# Patient Record
Sex: Female | Born: 1970 | Race: White | Hispanic: No | Marital: Married | Smoking: Never smoker
Health system: Southern US, Community
[De-identification: ages and names within clinical notes are randomized; demographics above are authoritative.]

## PROBLEM LIST (undated history)

## (undated) DIAGNOSIS — I10 Essential (primary) hypertension: Secondary | ICD-10-CM

## (undated) DIAGNOSIS — K9041 Non-celiac gluten sensitivity: Secondary | ICD-10-CM

## (undated) DIAGNOSIS — T7840XA Allergy, unspecified, initial encounter: Secondary | ICD-10-CM

## (undated) DIAGNOSIS — E785 Hyperlipidemia, unspecified: Secondary | ICD-10-CM

## (undated) HISTORY — DX: Non-celiac gluten sensitivity: K90.41

## (undated) HISTORY — DX: Hyperlipidemia, unspecified: E78.5

## (undated) HISTORY — PX: TUBAL LIGATION: SHX77

## (undated) HISTORY — DX: Allergy, unspecified, initial encounter: T78.40XA

## (undated) HISTORY — DX: Essential (primary) hypertension: I10

---

## 1998-03-04 ENCOUNTER — Other Ambulatory Visit: Admission: RE | Admit: 1998-03-04 | Discharge: 1998-03-04 | Payer: Self-pay | Admitting: Obstetrics and Gynecology

## 1999-03-05 ENCOUNTER — Other Ambulatory Visit: Admission: RE | Admit: 1999-03-05 | Discharge: 1999-03-05 | Payer: Self-pay | Admitting: Obstetrics and Gynecology

## 2000-03-06 ENCOUNTER — Other Ambulatory Visit: Admission: RE | Admit: 2000-03-06 | Discharge: 2000-03-06 | Payer: Self-pay | Admitting: *Deleted

## 2001-03-19 ENCOUNTER — Other Ambulatory Visit: Admission: RE | Admit: 2001-03-19 | Discharge: 2001-03-19 | Payer: Self-pay | Admitting: Obstetrics and Gynecology

## 2002-03-11 ENCOUNTER — Inpatient Hospital Stay (HOSPITAL_COMMUNITY): Admission: AD | Admit: 2002-03-11 | Discharge: 2002-03-16 | Payer: Self-pay | Admitting: Obstetrics and Gynecology

## 2002-04-03 ENCOUNTER — Encounter: Admission: RE | Admit: 2002-04-03 | Discharge: 2002-05-03 | Payer: Self-pay | Admitting: Obstetrics and Gynecology

## 2002-04-24 ENCOUNTER — Other Ambulatory Visit: Admission: RE | Admit: 2002-04-24 | Discharge: 2002-04-24 | Payer: Self-pay | Admitting: Obstetrics and Gynecology

## 2003-06-20 ENCOUNTER — Other Ambulatory Visit: Admission: RE | Admit: 2003-06-20 | Discharge: 2003-06-20 | Payer: Self-pay | Admitting: Obstetrics and Gynecology

## 2003-07-06 ENCOUNTER — Inpatient Hospital Stay (HOSPITAL_COMMUNITY): Admission: AD | Admit: 2003-07-06 | Discharge: 2003-07-07 | Payer: Self-pay | Admitting: Obstetrics & Gynecology

## 2003-07-10 ENCOUNTER — Inpatient Hospital Stay (HOSPITAL_COMMUNITY): Admission: AD | Admit: 2003-07-10 | Discharge: 2003-07-11 | Payer: Self-pay | Admitting: Obstetrics and Gynecology

## 2004-11-01 ENCOUNTER — Inpatient Hospital Stay (HOSPITAL_COMMUNITY): Admission: AD | Admit: 2004-11-01 | Discharge: 2004-11-03 | Payer: Self-pay | Admitting: Obstetrics and Gynecology

## 2005-12-20 ENCOUNTER — Encounter: Admission: RE | Admit: 2005-12-20 | Discharge: 2005-12-20 | Payer: Self-pay | Admitting: Gastroenterology

## 2006-02-28 ENCOUNTER — Encounter: Admission: RE | Admit: 2006-02-28 | Discharge: 2006-02-28 | Payer: Self-pay | Admitting: Gastroenterology

## 2006-03-09 ENCOUNTER — Encounter: Admission: RE | Admit: 2006-03-09 | Discharge: 2006-03-09 | Payer: Self-pay | Admitting: Gastroenterology

## 2011-02-08 ENCOUNTER — Other Ambulatory Visit: Payer: Self-pay | Admitting: Obstetrics and Gynecology

## 2012-04-17 ENCOUNTER — Ambulatory Visit (INDEPENDENT_AMBULATORY_CARE_PROVIDER_SITE_OTHER): Payer: BC Managed Care – PPO | Admitting: Emergency Medicine

## 2012-04-17 ENCOUNTER — Encounter: Payer: Self-pay | Admitting: Emergency Medicine

## 2012-04-17 VITALS — BP 128/83 | HR 60 | Temp 97.3°F | Resp 16 | Ht 64.0 in | Wt 124.0 lb

## 2012-04-17 DIAGNOSIS — K9041 Non-celiac gluten sensitivity: Secondary | ICD-10-CM

## 2012-04-17 DIAGNOSIS — Z0000 Encounter for general adult medical examination without abnormal findings: Secondary | ICD-10-CM

## 2012-04-17 LAB — COMPREHENSIVE METABOLIC PANEL
Chloride: 103 mEq/L (ref 96–112)
Creat: 0.82 mg/dL (ref 0.50–1.10)
Glucose, Bld: 76 mg/dL (ref 70–99)
Potassium: 3.9 mEq/L (ref 3.5–5.3)
Total Bilirubin: 0.8 mg/dL (ref 0.3–1.2)

## 2012-04-17 LAB — CBC WITH DIFFERENTIAL/PLATELET
Eosinophils Absolute: 0.1 10*3/uL (ref 0.0–0.7)
HCT: 37.6 % (ref 36.0–46.0)
Hemoglobin: 12.7 g/dL (ref 12.0–15.0)
MCV: 86.4 fL (ref 78.0–100.0)
Monocytes Relative: 5 % (ref 3–12)
Neutro Abs: 4.8 10*3/uL (ref 1.7–7.7)
WBC: 6.9 10*3/uL (ref 4.0–10.5)

## 2012-04-17 LAB — POCT URINALYSIS DIPSTICK
Bilirubin, UA: NEGATIVE
Glucose, UA: NEGATIVE
Nitrite, UA: NEGATIVE

## 2012-04-17 LAB — POCT UA - MICROSCOPIC ONLY
Casts, Ur, LPF, POC: NEGATIVE
Crystals, Ur, HPF, POC: NEGATIVE
Mucus, UA: POSITIVE

## 2012-04-17 LAB — LIPID PANEL
Cholesterol: 166 mg/dL (ref 0–200)
HDL: 51 mg/dL (ref >39)
LDL Cholesterol: 99 mg/dL (ref 0–99)

## 2012-04-17 LAB — TSH: TSH: 4.308 u[IU]/mL (ref 0.350–4.500)

## 2012-04-17 NOTE — Progress Notes (Deleted)
  Subjective:    Patient ID: Brittany Howard, female    DOB: December 15, 1970, 41 y.o.   MRN: [id number]  HPI    Review of Systems     Objective:   Physical Exam        Assessment & Plan:

## 2012-04-17 NOTE — Patient Instructions (Signed)
Return in 1 year ?

## 2012-04-17 NOTE — Progress Notes (Signed)
  Subjective:    Patient ID: Brittany Howard, female    DOB: 11-Jul-1971, 41 y.o.   MRN: [id number]  HPI    Review of Systems  Constitutional: Negative.   HENT: Negative.   Eyes: Negative.   Respiratory: Negative.   Cardiovascular: Negative.   Gastrointestinal:       Patient history of gluten intolerance. She's doing well since staying on a gluten-free diet  Genitourinary:       Patient is a regular patient of Dr. Franco Collet  Musculoskeletal: Negative.   Skin: Negative.   Neurological: Negative.   Hematological: Negative.   Psychiatric/Behavioral: Negative.        Objective:   Physical Exam HEENT exam is within normal limits. Her neck is supple. Her chest is clear to auscultation and percussion. Cardiac exam reveals regular rate without murmurs. Abdomen soft liver and spleen not enlarged no tenderness. Extremities are without edema neurological is intact .  EKG NSR      Assessment & Plan:  Routine labs done today as well as a baseline EKG .

## 2012-11-12 ENCOUNTER — Other Ambulatory Visit: Payer: Self-pay

## 2012-11-12 DIAGNOSIS — Z1231 Encounter for screening mammogram for malignant neoplasm of breast: Secondary | ICD-10-CM

## 2012-12-10 ENCOUNTER — Ambulatory Visit
Admission: RE | Admit: 2012-12-10 | Discharge: 2012-12-10 | Disposition: A | Discharge status: Home / Self Care | Payer: BC Managed Care – PPO | Source: Ambulatory Visit

## 2012-12-10 DIAGNOSIS — Z1231 Encounter for screening mammogram for malignant neoplasm of breast: Secondary | ICD-10-CM

## 2013-04-09 ENCOUNTER — Encounter: Payer: BC Managed Care – PPO | Admitting: Emergency Medicine

## 2013-04-23 ENCOUNTER — Encounter: Payer: BC Managed Care – PPO | Admitting: Emergency Medicine

## 2013-05-28 ENCOUNTER — Encounter: Payer: Self-pay | Admitting: Emergency Medicine

## 2013-05-28 ENCOUNTER — Ambulatory Visit (INDEPENDENT_AMBULATORY_CARE_PROVIDER_SITE_OTHER): Payer: BC Managed Care – PPO | Admitting: Emergency Medicine

## 2013-05-28 VITALS — BP 135/83 | HR 50 | Temp 98.3°F | Resp 16 | Ht 64.0 in | Wt 125.0 lb

## 2013-05-28 DIAGNOSIS — Z23 Encounter for immunization: Secondary | ICD-10-CM

## 2013-05-28 DIAGNOSIS — Z0000 Encounter for general adult medical examination without abnormal findings: Secondary | ICD-10-CM

## 2013-05-28 LAB — TSH: TSH: 2.242 u[IU]/mL (ref 0.350–4.500)

## 2013-05-28 LAB — CBC WITH DIFFERENTIAL/PLATELET
Basophils Absolute: 0.1 10*3/uL (ref 0.0–0.1)
Eosinophils Relative: 1 % (ref 0–5)
HCT: 38.5 % (ref 36.0–46.0)
Hemoglobin: 12.7 g/dL (ref 12.0–15.0)
Lymphocytes Relative: 29 % (ref 12–46)
MCV: 87.7 fL (ref 78.0–100.0)
RBC: 4.39 MIL/uL (ref 3.87–5.11)
RDW: 13.3 % (ref 11.5–15.5)

## 2013-05-28 LAB — COMPREHENSIVE METABOLIC PANEL
Albumin: 4.6 g/dL (ref 3.5–5.2)
Alkaline Phosphatase: 61 U/L (ref 39–117)
CO2: 29 mEq/L (ref 19–32)
Calcium: 9.4 mg/dL (ref 8.4–10.5)
Creat: 0.95 mg/dL (ref 0.50–1.10)
Total Bilirubin: 0.3 mg/dL (ref 0.3–1.2)

## 2013-05-28 LAB — POCT URINALYSIS DIPSTICK
Glucose, UA: NEGATIVE
Ketones, UA: NEGATIVE
Leukocytes, UA: NEGATIVE
Protein, UA: NEGATIVE
Urobilinogen, UA: 0.2

## 2013-05-28 NOTE — Progress Notes (Addendum)
@UMFCLOGO @  Patient ID: Brittany Howard MRN: [id number], DOB: 03/05/1971, 42 y.o. Date of Encounter: 05/28/2013, 4:44 PM  Primary Physician: No primary provider on file.  Chief Complaint: Physical (CPE)  HPI: 42 y.o. y/o female with history of noted below here for CPE.  Doing well. No issues/complaints.  LMP:  Pap: MMG: Review of Systems:  Consitutional: No fever, chills, fatigue, night sweats, lymphadenopathy, or weight changes. Eyes: No visual changes, eye redness, or discharge. ENT/Mouth: Ears: No otalgia, tinnitus, hearing loss, discharge. Nose: No congestion, rhinorrhea, sinus pain, or epistaxis. Throat: No sore throat, post nasal drip, or teeth pain. Cardiovascular: No CP, palpitations, diaphoresis, DOE, edema, orthopnea, PND. Respiratory: She has a history of exercise-induced asthma she has Dulera to take but rarely takes it. As a pro-air inhaler to use as  Gastrointestinal: No anorexia, dysphagia, reflux, pain, nausea, vomiting, hematemesis, diarrhea, constipation, BRBPR, or melena. She has a history of gluten sensitivity but as long as she watches her diet she has no symptoms Breast: No discharge, pain, swelling, or mass. Genitourinary: No dysuria, frequency, urgency, hematuria, incontinence, nocturia, amenorrhea, vaginal discharge, pruritis, burning, abnormal bleeding, or pain. She has her regular GYN checkups with her gynecologist . Musculoskeletal: No decreased ROM, myalgias, stiffness, joint swelling, or weakness. Skin: No rash, erythema, lesion changes, pain, warmth, jaundice, or pruritis. Neurological: No headache, dizziness, syncope, seizures, tremors, memory loss, coordination problems, or paresthesias. Psychological: No anxiety, depression, hallucinations, SI/HI. Endocrine: No fatigue, polydipsia, polyphagia, polyuria, or known diabetes. All other systems were reviewed and are otherwise negative.  Past Medical History  Diagnosis Date  . Allergy   . Gluten  intolerance      Past Surgical History  Procedure Laterality Date  . Tubal ligation      1 tube removed due to ectopic pregnancy in 2004.     Home Meds:  Prior to Admission medications   Not on File    Allergies: Not on File  History   Social History  . Marital Status: Single    Spouse Name: N/A    Number of Children: N/A  . Years of Education: N/A   Occupational History  . Not on file.   Social History Main Topics  . Smoking status: Never Smoker   . Smokeless tobacco: Not on file  . Alcohol Use: Yes  . Drug Use: No  . Sexual Activity: Yes   Other Topics Concern  . Not on file   Social History Narrative  . No narrative on file    Family History  Problem Relation Age of Onset  . Hyperlipidemia Mother   . Hypertension Mother   . Alzheimer's disease Father     Physical Exam  Blood pressure 135/83, pulse 50, temperature 98.3 F (36.8 C), resp. rate 16, height 5\' 4"  (1.626 m), weight 125 lb (56.7 kg), last menstrual period 05/22/2013., Body mass index is 21.45 kg/(m^2). General: Well developed, well nourished, in no acute distress. HEENT: Normocephalic, atraumatic. Conjunctiva pink, sclera non-icteric. Pupils 2 mm constricting to 1 mm, round, regular, and equally reactive to light and accomodation. EOMI. Internal auditory canal clear. TMs with good cone of light and without pathology. Nasal mucosa pink. Nares are without discharge. No sinus tenderness. Oral mucosa pink. Dentition . Pharynx without exudate.   Neck: Supple. Trachea midline. No thyromegaly. Full ROM. No lymphadenopathy. Lungs: Clear to auscultation bilaterally without wheezes, rales, or rhonchi. Breathing is of normal effort and unlabored. Cardiovascular: RRR with S1 S2. No murmurs, rubs, or gallops appreciated. Distal pulses  2+ symmetrically. No carotid or abdominal bruits.  Breast: Deferred  Abdomen: Soft, non-tender, non-distended with normoactive bowel sounds. No hepatosplenomegaly or masses.  No rebound/guarding. No CVA tenderness. Without hernias.  Genitourinary:  Deferred   Musculoskeletal: Full range of motion and 5/5 strength throughout. Without swelling, atrophy, tenderness, crepitus, or warmth. Extremities without clubbing, cyanosis, or edema. Calves supple. Skin: Warm and moist without erythema, ecchymosis, wounds, or rash. Neuro: A+Ox3. CN II-XII grossly intact. Moves all extremities spontaneously. Full sensation throughout. Normal gait. DTR 2+ throughout upper and lower extremities. Finger to nose intact. Psych:  Responds to questions appropriately with a normal affect.   Results for orders placed in visit on 05/28/13  POCT URINALYSIS DIPSTICK      Result Value Range   Color, UA yellow     Clarity, UA clear     Glucose, UA neg     Bilirubin, UA neg     Ketones, UA neg     Spec Grav, UA <=1.005     Blood, UA neg     pH, UA 6.0     Protein, UA neg     Urobilinogen, UA 0.2     Nitrite, UA neg     Leukocytes, UA Negative         Assessment/Plan:  42 y.o. y/o female here for CPE her only medical problems a sensitivity to wheat. She exercises regularly drinks rarely and does not smoke. Her weight is perfect. We'll recheck on a yearly basis.  -  Signed, Earl Lites, MD 05/28/2013 4:44 PM

## 2013-06-13 ENCOUNTER — Ambulatory Visit (INDEPENDENT_AMBULATORY_CARE_PROVIDER_SITE_OTHER): Payer: BC Managed Care – PPO | Admitting: Emergency Medicine

## 2013-06-13 VITALS — BP 120/76 | HR 68 | Temp 99.2°F | Resp 16 | Ht 64.0 in | Wt 125.0 lb

## 2013-06-13 DIAGNOSIS — Z87440 Personal history of urinary (tract) infections: Secondary | ICD-10-CM

## 2013-06-13 DIAGNOSIS — R21 Rash and other nonspecific skin eruption: Secondary | ICD-10-CM

## 2013-06-13 LAB — POCT CBC
Granulocyte percent: 61.3 %G (ref 37–80)
Hemoglobin: 14 g/dL (ref 12.2–16.2)
MCHC: 31.7 g/dL — AB (ref 31.8–35.4)
MPV: 11.1 fL (ref 0–99.8)
POC Granulocyte: 1.9 — AB (ref 2–6.9)
POC LYMPH PERCENT: 30.5 %L (ref 10–50)
POC MID %: 8.2 %M (ref 0–12)
Platelet Count, POC: 202 10*3/uL (ref 142–424)
RBC: 4.65 M/uL (ref 4.04–5.48)
WBC: 3.1 10*3/uL — AB (ref 4.6–10.2)

## 2013-06-13 LAB — POCT URINALYSIS DIPSTICK
Bilirubin, UA: NEGATIVE
Blood, UA: NEGATIVE
Ketones, UA: NEGATIVE
Nitrite, UA: NEGATIVE
Protein, UA: NEGATIVE
pH, UA: 5.5

## 2013-06-13 LAB — POCT UA - MICROSCOPIC ONLY
Casts, Ur, LPF, POC: NEGATIVE
Crystals, Ur, HPF, POC: NEGATIVE
RBC, urine, microscopic: NEGATIVE

## 2013-06-13 LAB — COMPREHENSIVE METABOLIC PANEL
Albumin: 4.4 g/dL (ref 3.5–5.2)
Alkaline Phosphatase: 56 U/L (ref 39–117)
BUN: 12 mg/dL (ref 6–23)
CO2: 25 mEq/L (ref 19–32)
Creat: 0.95 mg/dL (ref 0.50–1.10)
Potassium: 4.1 mEq/L (ref 3.5–5.3)
Sodium: 136 mEq/L (ref 135–145)
Total Protein: 7 g/dL (ref 6.0–8.3)

## 2013-06-13 MED ORDER — DIPHENHYDRAMINE HCL 25 MG PO CAPS
25.0000 mg | ORAL_CAPSULE | Freq: Once | ORAL | Status: AC
  Administered 2013-06-13: 25 mg via ORAL

## 2013-06-13 NOTE — Progress Notes (Signed)
  Subjective:    Patient ID: Brittany Howard, female    DOB: 1971/03/30, 42 y.o.   MRN: [id number]  HPI [name] to ER last week for UTI. Was given bactrim. This morning started with a rash. Fever started Sunday night. Appetite has been fine. Eyes have not been red. States labia is really sore this morning, was not previously. Has not done any traveling.    Review of Systems     Objective:   Physical Exam eyes are slightly red. TMs are clear. Throat reveals no ulcerations in the posterior pharynx neck is supple chest is clear heart regular rate no murmurs abdomen soft nontender extremities without edema skin exam reveals a maculopapular rash which involves the entire trunk and extremities.  Results for orders placed in visit on 06/13/13  POCT CBC      Result Value Range   WBC 3.1 (*) 4.6 - 10.2 K/uL   Lymph, poc 0.9  0.6 - 3.4   POC LYMPH PERCENT 30.5  10 - 50 %L   MID (cbc) 0.3  0 - 0.9   POC MID % 8.2  0 - 12 %M   POC Granulocyte 1.9 (*) 2 - 6.9   Granulocyte percent 61.3  37 - 80 %G   RBC 4.65  4.04 - 5.48 M/uL   Hemoglobin 14.0  12.2 - 16.2 g/dL   HCT, POC 14.7  82.9 - 47.9 %   MCV 94.8  80 - 97 fL   MCH, POC 30.1  27 - 31.2 pg   MCHC 31.7 (*) 31.8 - 35.4 g/dL   RDW, POC 56.2     Platelet Count, POC 202  142 - 424 K/uL   MPV 11.1  0 - 99.8 fL  POCT URINALYSIS DIPSTICK      Result Value Range   Color, UA yellow     Clarity, UA clear     Glucose, UA neg     Bilirubin, UA neg     Ketones, UA neg     Spec Grav, UA 1.015     Blood, UA neg     pH, UA 5.5     Protein, UA neg     Urobilinogen, UA 0.2     Nitrite, UA neg     Leukocytes, UA Negative    POCT UA - MICROSCOPIC ONLY      Result Value Range   WBC, Ur, HPF, POC neg     RBC, urine, microscopic neg     Bacteria, U Microscopic trace     Mucus, UA neg     Epithelial cells, urine per micros 0-2     Crystals, Ur, HPF, POC neg     Casts, Ur, LPF, POC neg     Yeast, UA neg          Assessment & Plan:  Patient  given Zyrtec 10 mg Benadryl 25 mg and Zantac 150 mg by mouth. Her physical exam and history are all consistent with a drug reaction to sulfa. She has significant concerns about Stevens-Johnson. Her white count is low. She had no mucosal abnormalities noted. Dr. Karlyn Agee has been kind enough to see the patient.

## 2013-11-15 ENCOUNTER — Other Ambulatory Visit: Payer: Self-pay

## 2013-11-15 DIAGNOSIS — Z1231 Encounter for screening mammogram for malignant neoplasm of breast: Secondary | ICD-10-CM

## 2013-12-13 ENCOUNTER — Ambulatory Visit
Admission: RE | Admit: 2013-12-13 | Discharge: 2013-12-13 | Disposition: A | Discharge status: Home / Self Care | Payer: BC Managed Care – PPO | Source: Ambulatory Visit

## 2013-12-13 DIAGNOSIS — Z1231 Encounter for screening mammogram for malignant neoplasm of breast: Secondary | ICD-10-CM

## 2014-02-06 ENCOUNTER — Ambulatory Visit (INDEPENDENT_AMBULATORY_CARE_PROVIDER_SITE_OTHER): Payer: BC Managed Care – PPO | Admitting: Family Medicine

## 2014-02-06 ENCOUNTER — Telehealth: Payer: Self-pay

## 2014-02-06 VITALS — BP 128/78 | HR 62 | Temp 98.3°F | Resp 18 | Ht 63.0 in | Wt 121.6 lb

## 2014-02-06 DIAGNOSIS — R05 Cough: Secondary | ICD-10-CM

## 2014-02-06 DIAGNOSIS — J309 Allergic rhinitis, unspecified: Secondary | ICD-10-CM

## 2014-02-06 DIAGNOSIS — R059 Cough, unspecified: Secondary | ICD-10-CM

## 2014-02-06 MED ORDER — BENZONATATE 100 MG PO CAPS: 100.0000 mg | ORAL_CAPSULE | Freq: Three times a day (TID) | ORAL | Status: DC | PRN

## 2014-02-06 MED ORDER — BENZONATATE 200 MG PO CAPS: 200.0000 mg | ORAL_CAPSULE | Freq: Three times a day (TID) | ORAL | Status: DC | PRN

## 2014-02-06 MED ORDER — HYDROCODONE-HOMATROPINE 5-1.5 MG/5ML PO SYRP: ORAL_SOLUTION | ORAL | Status: DC

## 2014-02-06 NOTE — Patient Instructions (Signed)
Start Claritin, Zyrtec, or Allegra once per day. Tessalon perles during day as discussed, cough drops or lozenges as needed (avoid mints).  Cough syrup at night if needed. If cough not improving  Into next week - can try over the counter Zantac in case acid reflux is causing the cough, but if not improving in next week to 10 days, or any worsening sooner - return for recheck.   Cough, Adult  A cough is a reflex that helps clear your throat and airways. It can help heal the body or may be a reaction to an irritated airway. A cough may only last 2 or 3 weeks (acute) or may last more than 8 weeks (chronic).  CAUSES Acute cough:  Viral or bacterial infections. Chronic cough:  Infections.  Allergies.  Asthma.  Post-nasal drip.  Smoking.  Heartburn or acid reflux.  Some medicines.  Chronic lung problems (COPD).  Cancer. SYMPTOMS   Cough.  Fever.  Chest pain.  Increased breathing rate.  High-pitched whistling sound when breathing (wheezing).  Colored mucus that you cough up (sputum). TREATMENT   A bacterial cough may be treated with antibiotic medicine.  A viral cough must run its course and will not respond to antibiotics.  Your caregiver may recommend other treatments if you have a chronic cough. HOME CARE INSTRUCTIONS   Only take over-the-counter or prescription medicines for pain, discomfort, or fever as directed by your caregiver. Use cough suppressants only as directed by your caregiver.  Use a cold steam vaporizer or humidifier in your bedroom or home to help loosen secretions.  Sleep in a semi-upright position if your cough is worse at night.  Rest as needed.  Stop smoking if you smoke. SEEK IMMEDIATE MEDICAL CARE IF:   You have pus in your sputum.  Your cough starts to worsen.  You cannot control your cough with suppressants and are losing sleep.  You begin coughing up blood.  You have difficulty breathing.  You develop pain which is getting  worse or is uncontrolled with medicine.  You have a fever. MAKE SURE YOU:   Understand these instructions.  Will watch your condition.  Will get help right away if you are not doing well or get worse. Document Released: 02/11/2011 Document Revised: 11/07/2011 Document Reviewed: 02/11/2011 [facility] Patient Information 2014 [place].

## 2014-02-06 NOTE — Telephone Encounter (Signed)
[facility] called reporting backorder on 100 mg tessalon and asked if they can fill for 200 mg instead. Checked w/Sarah since sig was for 1 tablet TID instead of 1-2 tabs TID. After checking pt's med problems she ok'd the change and I notified pharm.

## 2014-02-06 NOTE — Progress Notes (Signed)
Subjective:    Patient ID: Brittany Howard, female    DOB: July 15, 1971, 43 y.o.   MRN: [id number]  HPI Brittany Howard is a 43 y.o. female  Dry cough, started about a week ago.  Tickle in back of throat. No fever, nonproductive.  No dyspnea. No congestion, no runny nose. No rx meds. No new supplements. No heartburn. Frequent throat clearing/tickle in throat.   Occasional seasonal allergies- no recent meds.   Tx: otc robitussin cough syrup, cough drops - no relief.    Patient Active Problem List   Diagnosis Date Noted  . 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 Allergy   . Gluten intolerance    Past Surgical History  Procedure Laterality Date  . Tubal ligation      1 tube removed due to ectopic pregnancy in 2004.    Allergies  Allergen Reactions  . Sulfa Antibiotics Rash   Prior to Admission medications   Not on File   History   Social History  . Marital Status: Single    Spouse Name: N/A    Number of Children: N/A  . Years of Education: N/A   Occupational History  . Not on file.   Social History Main Topics  . Smoking status: Never Smoker   . Smokeless tobacco: Not on file  . Alcohol Use: 0.0 oz/week    2-3 Glasses of wine per week     Comment: social  . Drug Use: No  . Sexual Activity: Yes   Other Topics Concern  . Not on file   Social History Narrative  . No narrative on file    Review of Systems  Constitutional: Negative for fever and chills.  Respiratory: Positive for cough. Negative for shortness of breath and wheezing.   Cardiovascular: Negative for chest pain and leg swelling.  Gastrointestinal: Negative for abdominal pain.       No heartburn.        Objective:   Physical Exam  Vitals reviewed. Constitutional: She is oriented to person, place, and time. She appears well-developed and well-nourished. No distress.  HENT:  Head: Normocephalic and atraumatic.  Right Ear: Hearing, tympanic membrane,  external ear and ear canal normal.  Left Ear: Hearing, tympanic membrane, external ear and ear canal normal.  Nose: Nose normal. No mucosal edema or rhinorrhea. Right sinus exhibits no maxillary sinus tenderness and no frontal sinus tenderness. Left sinus exhibits no maxillary sinus tenderness and no frontal sinus tenderness.  Mouth/Throat: Oropharynx is clear and moist. No oropharyngeal exudate.  Eyes: Conjunctivae and EOM are normal. Pupils are equal, round, and reactive to light.  Cardiovascular: Normal rate, regular rhythm, normal heart sounds and intact distal pulses.   No murmur heard. Pulmonary/Chest: Effort normal and breath sounds normal. No respiratory distress. She has no wheezes. She has no rhonchi.  Neurological: She is alert and oriented to person, place, and time.  Skin: Skin is warm and dry. No rash noted.  Psychiatric: She has a normal mood and affect. Her behavior is normal.   Filed Vitals:   02/06/14 0949  BP: 128/78  Pulse: 62  Temp: 98.3 F (36.8 C)  TempSrc: Oral  Resp: 18  Height: 5' 3"  (1.6 m)  Weight: 121 lb 9.6 oz (55.157 kg)  SpO2: 98%      Assessment & Plan:   Brittany Howard is a 43 y.o. female Cough - Plan: benzonatate (TESSALON) 100 MG capsule, HYDROcodone-homatropine (HYCODAN) 5-1.5 MG/5ML syrup  Allergic  rhinitis Possible PND from [place] as irritant/dry cough.  Start antihistamine, tessalon perles and hycodan qhs if needed as cough impacting sleep. Can try H2 for possible LPR if not improving next week.  rtc precautions.    Meds ordered this encounter  Medications  . benzonatate (TESSALON) 100 MG capsule    Sig: Take 1 capsule (100 mg total) by mouth 3 (three) times daily as needed for cough.    Dispense:  20 capsule    Refill:  0  . HYDROcodone-homatropine (HYCODAN) 5-1.5 MG/5ML syrup    Sig: 6mby mouth a bedtime as needed for cough.    Dispense:  120 mL    Refill:  0   Patient Instructions  Start Claritin, Zyrtec, or Allegra once per day.  Tessalon perles during day as discussed, cough drops or lozenges as needed (avoid mints).  Cough syrup at night if needed. If cough not improving  Into next week - can try over the counter Zantac in case acid reflux is causing the cough, but if not improving in next week to 10 days, or any worsening sooner - return for recheck.   Cough, Adult  A cough is a reflex that helps clear your throat and airways. It can help heal the body or may be a reaction to an irritated airway. A cough may only last 2 or 3 weeks (acute) or may last more than 8 weeks (chronic).  CAUSES Acute cough:  Viral or bacterial infections. Chronic cough:  Infections.  Allergies.  Asthma.  Post-nasal drip.  Smoking.  Heartburn or acid reflux.  Some medicines.  Chronic lung problems (COPD).  Cancer. SYMPTOMS   Cough.  Fever.  Chest pain.  Increased breathing rate.  High-pitched whistling sound when breathing (wheezing).  Colored mucus that you cough up (sputum). TREATMENT   A bacterial cough may be treated with antibiotic medicine.  A viral cough must run its course and will not respond to antibiotics.  Your caregiver may recommend other treatments if you have a chronic cough. HOME CARE INSTRUCTIONS   Only take over-the-counter or prescription medicines for pain, discomfort, or fever as directed by your caregiver. Use cough suppressants only as directed by your caregiver.  Use a cold steam vaporizer or humidifier in your bedroom or home to help loosen secretions.  Sleep in a semi-upright position if your cough is worse at night.  Rest as needed.  Stop smoking if you smoke. SEEK IMMEDIATE MEDICAL CARE IF:   You have pus in your sputum.  Your cough starts to worsen.  You cannot control your cough with suppressants and are losing sleep.  You begin coughing up blood.  You have difficulty breathing.  You develop pain which is getting worse or is uncontrolled with medicine.  You have  a fever. MAKE SURE YOU:   Understand these instructions.  Will watch your condition.  Will get help right away if you are not doing well or get worse. Document Released: 02/11/2011 Document Revised: 11/07/2011 Document Reviewed: 02/11/2011 [facility] Information 2014 [place]

## 2014-06-11 ENCOUNTER — Ambulatory Visit (INDEPENDENT_AMBULATORY_CARE_PROVIDER_SITE_OTHER): Payer: BC Managed Care – PPO | Admitting: Family Medicine

## 2014-06-11 VITALS — BP 132/82 | HR 64 | Temp 99.0°F | Resp 18 | Ht 64.0 in | Wt 126.2 lb

## 2014-06-11 DIAGNOSIS — Z23 Encounter for immunization: Secondary | ICD-10-CM

## 2014-06-11 DIAGNOSIS — S86811A Strain of other muscle(s) and tendon(s) at lower leg level, right leg, initial encounter: Secondary | ICD-10-CM

## 2014-06-11 NOTE — Patient Instructions (Signed)
Good to see you today! I think that you have a calf muscle strain/ partial tear that will likely be better in a couple of weeks.  You can advance your activity as tolerated, and continue advil, heat/ ice and gentle stretching.    If your calf if NOT improving over the next week or so please let me know- Sooner if worse.   Remember that jumping activities will be the most difficult for your calf; add these in last.

## 2014-06-11 NOTE — Progress Notes (Signed)
[facility] [address] [place] [address] [phone]  Date:  06/11/2014   Name:  Brittany Howard   DOB:  Jan 03, 1971   MRN:  [id number]  PCP:  No PCP Per Patient    Chief Complaint: calf injury   History of Present Illness:  Brittany Howard is a 43 y.o. very pleasant female patient who presents with the following:  Has been active in [facility] for a few years- she practices this with her son for exercise She was sparring at [employer] last night.  She was bouncing up and down on her feet.  She felt a "cramp" in her right calf, stopped and rested for a few minutes.  She then went back to her practice, but felt a "pull or a tear" in the calfthat caused her to stop for good.  It is now feeling somewhat better but is still sore so she wanted to check it out Flexing the ankle hurts - she can walk but it does hurt some.   She did sprain her right ankle about 6 weeks ago- this is getting better and she is wearing an OTC ankle sleeve as needed Otherwise she is generally in good health Would like to get a flu shot today  Patient Active Problem List   Diagnosis Date Noted  . 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 Allergy   . Gluten intolerance     Past Surgical History  Procedure Laterality Date  . Tubal ligation      1 tube removed due to ectopic pregnancy in 2004.     History  Substance Use Topics  . Smoking status: Never Smoker   . Smokeless tobacco: Not on file  . Alcohol Use: 0.0 oz/week    2-3 Glasses of wine per week     Comment: social    Family History  Problem Relation Age of Onset  . Hyperlipidemia Mother   . Hypertension Mother   . Alzheimer's disease Father   . Hyperlipidemia Father   . Hypertension Father   . Heart disease Paternal Grandmother   . Stroke Paternal Grandmother     Allergies  Allergen Reactions  . Sulfa Antibiotics Rash    Medication list has been reviewed and  updated.  No current outpatient prescriptions on file prior to visit.   No current facility-administered medications on file prior to visit.    Review of Systems:  As per HPI- otherwise negative.   Physical Examination: Filed Vitals:   06/11/14 0915  BP: 132/82  Pulse: 64  Temp: 99 F (37.2 C)  Resp: 18   Filed Vitals:   06/11/14 0915  Height: 5' 4"  (1.626 m)  Weight: 126 lb 3.2 oz (57.244 kg)   Body mass index is 21.65 kg/(m^2). Ideal Body Weight: Weight in (lb) to have BMI = 25: 145.3  GEN: WDWN, NAD, Non-toxic, A & O x 3, looks well, fit and healthy HEENT: Atraumatic, Normocephalic. Neck supple. No masses, No LAD. Ears and Nose: No external deformity. CV: RRR, No M/G/R. No JVD. No thrill. No extra heart sounds. PULM: CTA B, no wheezes, crackles, rhonchi. No retractions. No resp. distress. No accessory muscle use. EXTR: No c/c/e NEURO favoring right calf slightly PSYCH: Normally interactive. Conversant. Not depressed or anxious appearing.  Calm demeanor.  Right calf: achilles is intact.  Ankle is non- tender and not swollen.  Mild tenderness in the mid medial calf muscle.  Normal strength  and ROM at the knee and calf, normal foot cap refill and sensation.  No swelling of right calf, measures equally to left   Assessment and Plan: Strain of calf muscle, right, initial encounter - Plan: Flu Vaccine QUAD 36+ mos IM  Immunization due  Strain/ partial tear of right calf muscle.  Reassured that this will likely do well with conservative management.  Advised a graded return to activity, and she will let me know if not better in the next few days- Sooner if worse.     Signed Lamar Blinks, MD

## 2015-09-28 ENCOUNTER — Ambulatory Visit (INDEPENDENT_AMBULATORY_CARE_PROVIDER_SITE_OTHER): Payer: BLUE CROSS/BLUE SHIELD

## 2015-09-28 ENCOUNTER — Ambulatory Visit (INDEPENDENT_AMBULATORY_CARE_PROVIDER_SITE_OTHER): Payer: BLUE CROSS/BLUE SHIELD | Admitting: Emergency Medicine

## 2015-09-28 VITALS — BP 122/82 | HR 76 | Temp 98.6°F | Resp 17 | Ht 64.0 in | Wt 131.0 lb

## 2015-09-28 DIAGNOSIS — S60221A Contusion of right hand, initial encounter: Secondary | ICD-10-CM

## 2015-09-28 NOTE — Patient Instructions (Addendum)
Because you received an x-ray today, you will receive an invoice from [facility]. Please contact [facility] at [phone] with questions or concerns regarding your invoice. Our billing staff will not be able to assist you with those questions. The radiologist did not see a fracture on your films. Please avoid any direct contact to your thumb for one week. If your pain is persistent at that time please call and let me know.

## 2015-09-28 NOTE — Progress Notes (Addendum)
By signing my name below, I, Raven Small, attest that this documentation has been prepared under the direction and in the presence of Arlyss Queen, MD.  Electronically Signed: Thea Alken, ED Scribe. 09/28/2015. 8:58 AM.  Chief Complaint:  Chief Complaint  Patient presents with  . Hand Injury    right side    HPI: Brittany Howard is a 45 y.o. female who reports to [facility] today complaining of right hand injury. Pt does [place] do[place] and states she had to break 2 1inch boards put together with right hand. Since then she had graduallyt worsening right hand pain. She has been doing this for 4 years and has her black belt. Pt denies chance of pregnancy. She has hx of tubal ligation. Her husband had a vasectomy. LMP was 1 week ago.    Past Medical History  Diagnosis Date  . Allergy   . Gluten intolerance    Past Surgical History  Procedure Laterality Date  . Tubal ligation      1 tube removed due to ectopic pregnancy in 2004.    Social History   Social History  . Marital Status: Single    Spouse Name: N/A  . Number of Children: N/A  . Years of Education: N/A   Social History Main Topics  . Smoking status: Never Smoker   . Smokeless tobacco: None  . Alcohol Use: 0.0 oz/week    2-3 Glasses of wine per week     Comment: social  . Drug Use: No  . Sexual Activity: Yes   Other Topics Concern  . None   Social History Narrative   Family History  Problem Relation Age of Onset  . Hyperlipidemia Mother   . Hypertension Mother   . Alzheimer's disease Father   . Hyperlipidemia Father   . Hypertension Father   . Heart disease Paternal Grandmother   . Stroke Paternal Grandmother    Allergies  Allergen Reactions  . Sulfa Antibiotics Rash   Prior to Admission medications   Not on File     ROS: The patient denies fevers, chills, night sweats, unintentional weight loss, chest pain, palpitations, wheezing, dyspnea on exertion, nausea, vomiting, abdominal pain, dysuria,  hematuria, melena, numbness, weakness, or tingling.   All other systems have been reviewed and were otherwise negative with the exception of those mentioned in the HPI and as above.    PHYSICAL EXAM: Filed Vitals:   09/28/15 0850  BP: 122/82  Pulse: 76  Temp: 98.6 F (37 C)  Resp: 17   Body mass index is 22.47 kg/(m^2).   General: Alert, no acute distress HEENT:  Normocephalic, atraumatic, oropharynx patent. Eye: Juliette Mangle [facility] Cardiovascular:  Regular rate and rhythm, no rubs murmurs or gallops.  No Carotid bruits, radial pulse intact. No pedal edema.  Respiratory: Clear to auscultation bilaterally.  No wheezes, rales, or rhonchi.  No cyanosis, no use of accessory musculature Abdominal: No organomegaly, abdomen is soft and non-tender, positive bowel sounds.  No masses. Musculoskeletal: Gait intact. Bruising present over the base of the [facility] of right hand. Near full ROM. no joint instability.  Skin: No rashes. Neurologic: Facial musculature symmetric. Psychiatric: Patient acts appropriately throughout our interaction. Lymphatic: No cervical or submandibular lymphadenopathy   EKG/XRAY:   Primary read interpreted by Dr. Everlene Farrier at [facility]. On one view there appears to be a possible crack in the trapezium please comment.  Dg Finger Thumb Right  09/28/2015  CLINICAL DATA:  Right thumb injury. EXAM: RIGHT THUMB 2+V  COMPARISON:  No recent prior . FINDINGS: There is no evidence of fracture or dislocation. There is no evidence of arthropathy or other focal bone abnormality. Soft tissues are unremarkable IMPRESSION: No acute bony or joint abnormality identified. No evidence of fracture or dislocation. Electronically Signed   By: Marcello Moores  Register   On: 09/28/2015 09:16   ASSESSMENT/PLAN: The radiologist did not feel there was a fracture. Will treat the area with ice along with nonsteroidals she is to call me the end of the week if pain is persistent.   Gross sideeffects, risk and benefits,  and alternatives of medications d/w patient. Patient is aware that all medications have potential sideeffects and we are unable to predict every sideeffect or drug-drug interaction that may occur.  Arlyss Queen MD 09/28/2015 8:53 AM

## 2016-05-11 DIAGNOSIS — Z6822 Body mass index (BMI) 22.0-22.9, adult: Secondary | ICD-10-CM | POA: Diagnosis not present

## 2016-05-11 DIAGNOSIS — Z01419 Encounter for gynecological examination (general) (routine) without abnormal findings: Secondary | ICD-10-CM | POA: Diagnosis not present

## 2016-05-11 DIAGNOSIS — Z1231 Encounter for screening mammogram for malignant neoplasm of breast: Secondary | ICD-10-CM | POA: Diagnosis not present

## 2016-05-13 ENCOUNTER — Other Ambulatory Visit: Payer: Self-pay | Admitting: Obstetrics and Gynecology

## 2016-05-13 DIAGNOSIS — R928 Other abnormal and inconclusive findings on diagnostic imaging of breast: Secondary | ICD-10-CM

## 2016-05-23 ENCOUNTER — Ambulatory Visit
Admission: RE | Admit: 2016-05-23 | Discharge: 2016-05-23 | Disposition: A | Discharge status: Home / Self Care | Payer: BLUE CROSS/BLUE SHIELD | Source: Ambulatory Visit | Attending: Obstetrics and Gynecology | Admitting: Obstetrics and Gynecology

## 2016-05-23 DIAGNOSIS — R928 Other abnormal and inconclusive findings on diagnostic imaging of breast: Secondary | ICD-10-CM

## 2016-05-23 DIAGNOSIS — R922 Inconclusive mammogram: Secondary | ICD-10-CM | POA: Diagnosis not present

## 2016-11-27 IMAGING — MG 2D DIGITAL DIAGNOSTIC UNILATERAL RIGHT MAMMOGRAM WITH CAD AND AD
4 series · 4 of 12 positions shown · non-contrast
Comparison: Previous exam(s).

CLINICAL DATA: The recall from screening mammography.

EXAM:
2D DIGITAL DIAGNOSTIC UNILATERAL RIGHT MAMMOGRAM WITH CAD AND
ADJUNCT TOMO

[R ML]
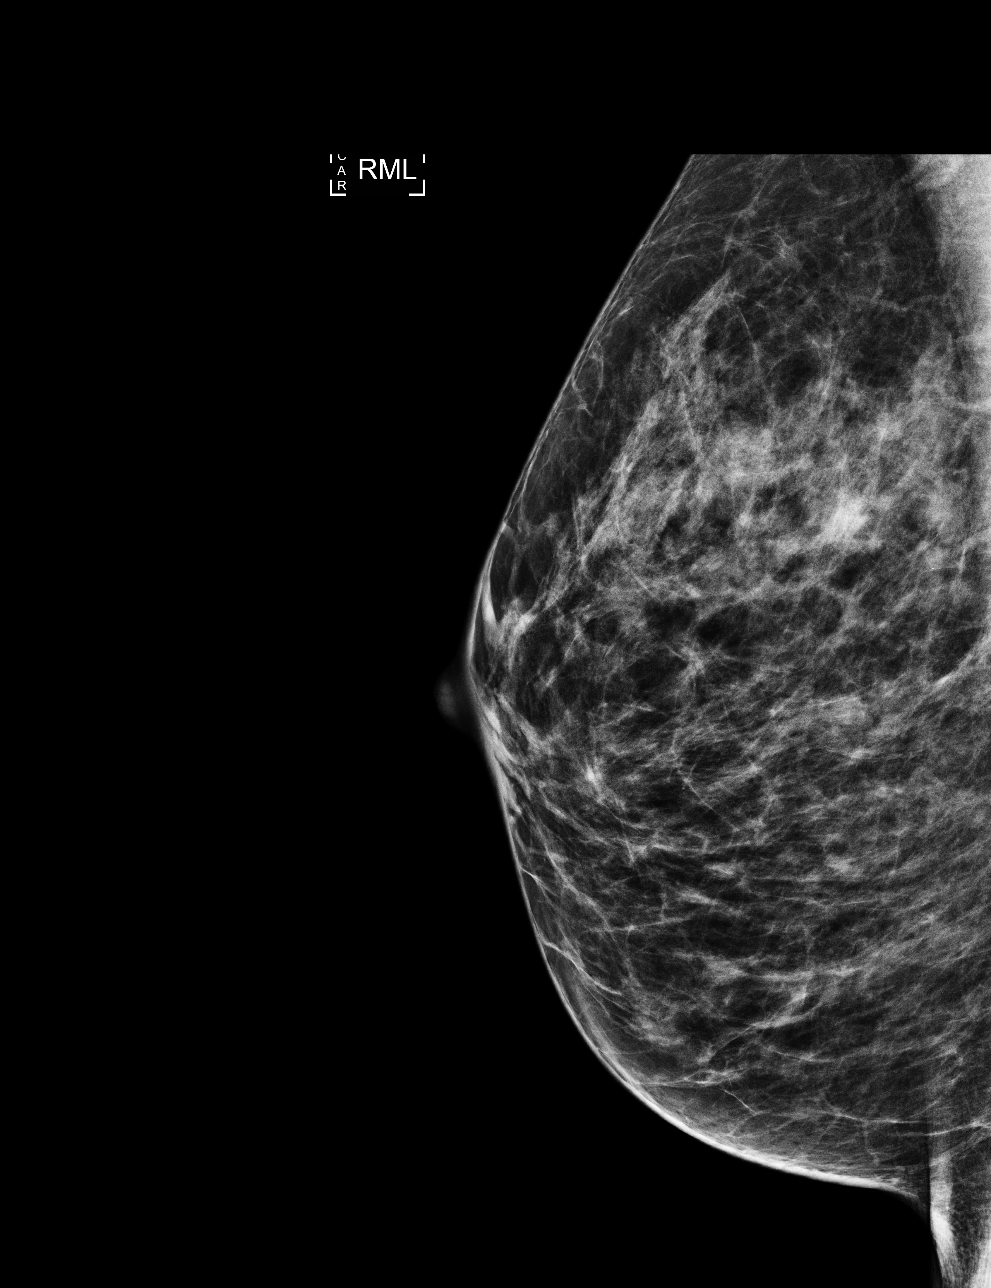

[R CC]
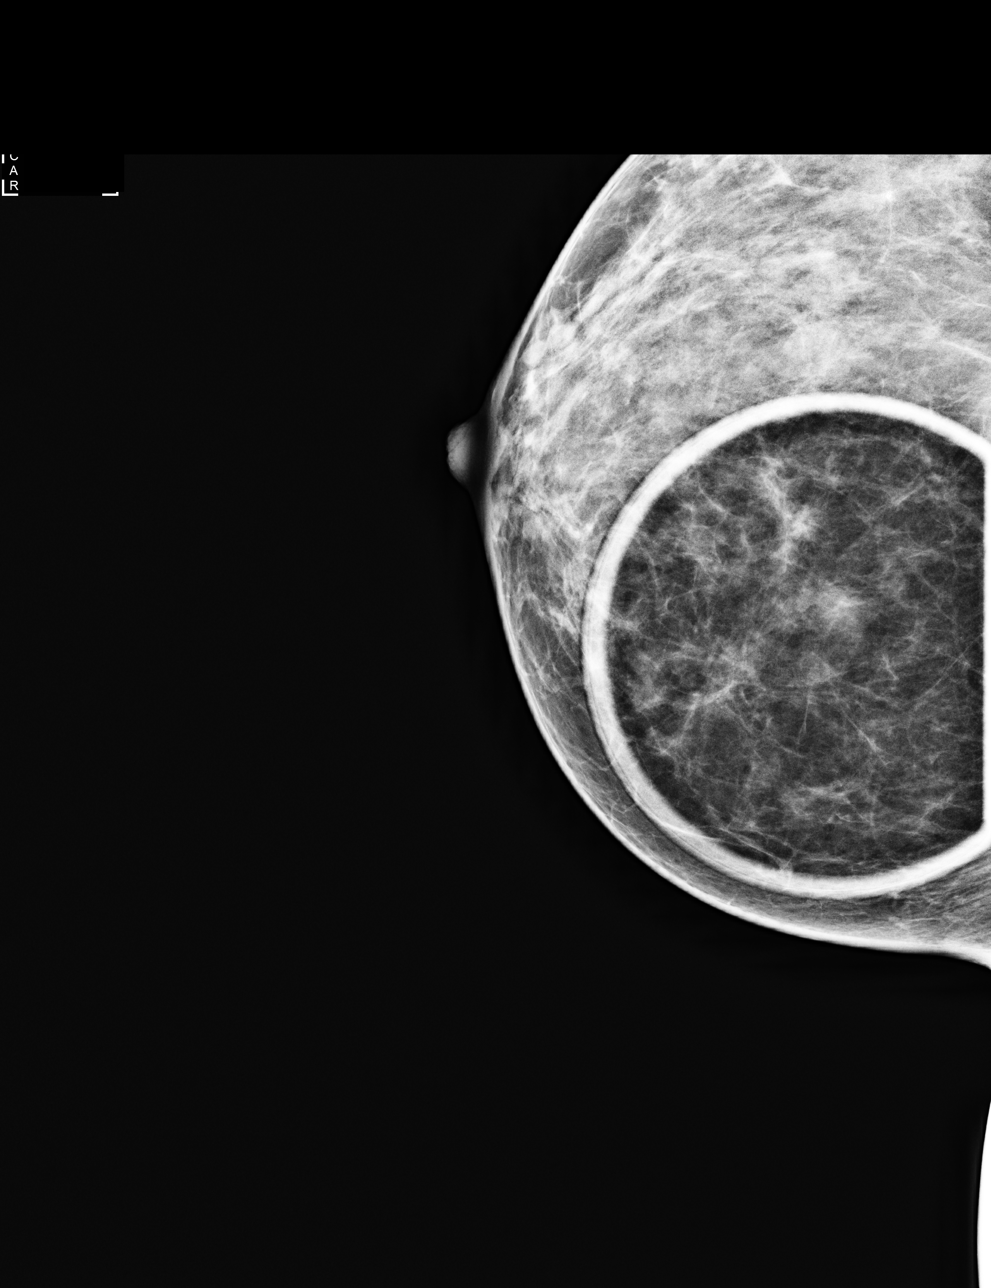

[R ML tomo · tomo slice 25/49.0]
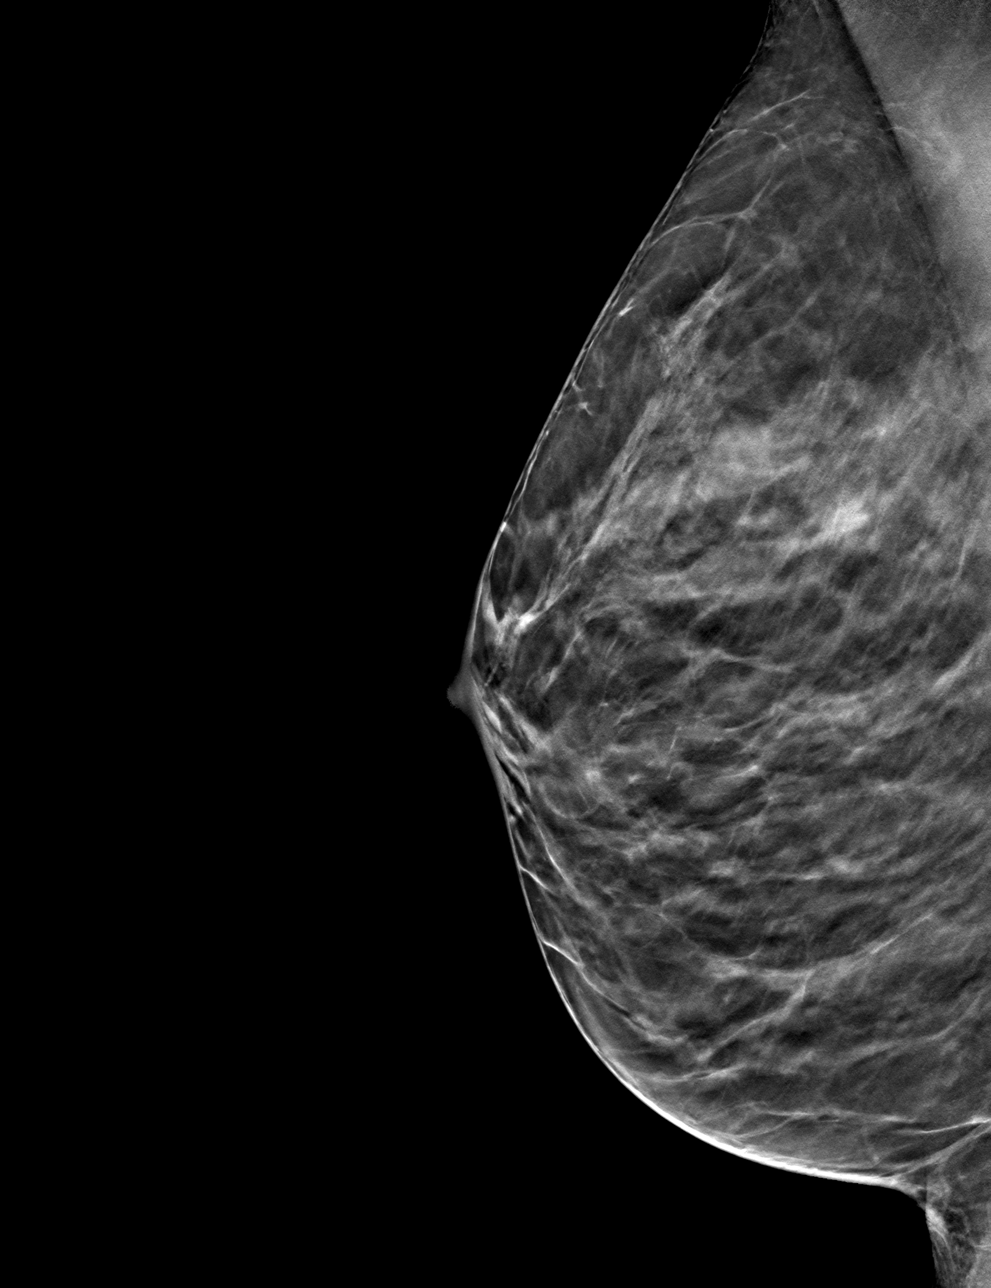

[R CC tomo · tomo slice 24/47.0]
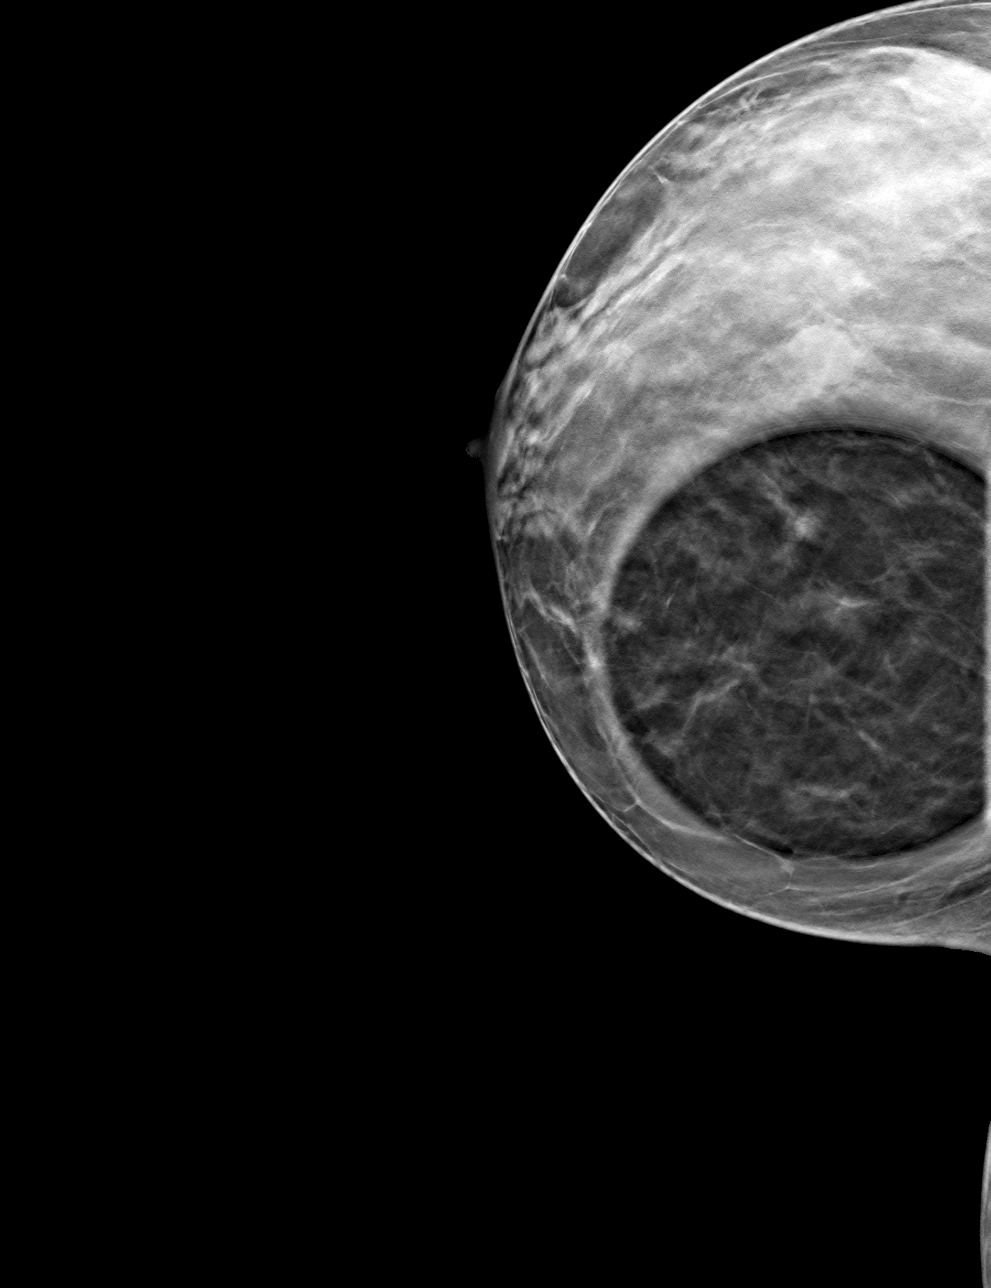

[4 of 12 positions shown; findings below may reference images not displayed]

ACR Breast Density Category c: The breast tissue is heterogeneously
dense, which may obscure small masses.
FINDINGS: Additional views of the right breast with tomosynthesis demonstrate
no persistent abnormality. The appearance noted on the screening
study is consistent with a summation shadow.

Mammographic images were processed with CAD.
IMPRESSION: No persistent abnormality on additional evaluation the right breast.

RECOMMENDATION:
Screening mammography in 1 year.

I have discussed the findings and recommendations with the patient.
Results were also provided in writing at the conclusion of the
visit. If applicable, a reminder letter will be sent to the patient
regarding the next appointment.

BI-RADS CATEGORY  1: Negative.

## 2017-07-31 DIAGNOSIS — Z01419 Encounter for gynecological examination (general) (routine) without abnormal findings: Secondary | ICD-10-CM | POA: Diagnosis not present

## 2017-07-31 DIAGNOSIS — Z6822 Body mass index (BMI) 22.0-22.9, adult: Secondary | ICD-10-CM | POA: Diagnosis not present

## 2017-07-31 DIAGNOSIS — Z1231 Encounter for screening mammogram for malignant neoplasm of breast: Secondary | ICD-10-CM | POA: Diagnosis not present

## 2018-08-24 DIAGNOSIS — Z1151 Encounter for screening for human papillomavirus (HPV): Secondary | ICD-10-CM | POA: Diagnosis not present

## 2018-08-24 DIAGNOSIS — Z01419 Encounter for gynecological examination (general) (routine) without abnormal findings: Secondary | ICD-10-CM | POA: Diagnosis not present

## 2018-08-24 DIAGNOSIS — Z124 Encounter for screening for malignant neoplasm of cervix: Secondary | ICD-10-CM | POA: Diagnosis not present

## 2018-08-24 DIAGNOSIS — Z6823 Body mass index (BMI) 23.0-23.9, adult: Secondary | ICD-10-CM | POA: Diagnosis not present

## 2018-08-24 DIAGNOSIS — Z113 Encounter for screening for infections with a predominantly sexual mode of transmission: Secondary | ICD-10-CM | POA: Diagnosis not present

## 2018-08-24 DIAGNOSIS — Z23 Encounter for immunization: Secondary | ICD-10-CM | POA: Diagnosis not present

## 2018-08-24 DIAGNOSIS — Z1231 Encounter for screening mammogram for malignant neoplasm of breast: Secondary | ICD-10-CM | POA: Diagnosis not present

## 2019-02-27 DIAGNOSIS — Z1159 Encounter for screening for other viral diseases: Secondary | ICD-10-CM | POA: Diagnosis not present

## 2019-10-16 DIAGNOSIS — Z6821 Body mass index (BMI) 21.0-21.9, adult: Secondary | ICD-10-CM | POA: Diagnosis not present

## 2019-10-16 DIAGNOSIS — Z1231 Encounter for screening mammogram for malignant neoplasm of breast: Secondary | ICD-10-CM | POA: Diagnosis not present

## 2019-10-16 DIAGNOSIS — Z01419 Encounter for gynecological examination (general) (routine) without abnormal findings: Secondary | ICD-10-CM | POA: Diagnosis not present

## 2019-10-19 ENCOUNTER — Other Ambulatory Visit: Payer: Self-pay | Admitting: Obstetrics and Gynecology

## 2019-10-19 DIAGNOSIS — R928 Other abnormal and inconclusive findings on diagnostic imaging of breast: Secondary | ICD-10-CM

## 2019-10-21 ENCOUNTER — Other Ambulatory Visit: Payer: Self-pay | Admitting: Obstetrics and Gynecology

## 2019-10-21 DIAGNOSIS — R928 Other abnormal and inconclusive findings on diagnostic imaging of breast: Secondary | ICD-10-CM

## 2019-10-25 DIAGNOSIS — Z13220 Encounter for screening for lipoid disorders: Secondary | ICD-10-CM | POA: Diagnosis not present

## 2019-10-25 DIAGNOSIS — Z1329 Encounter for screening for other suspected endocrine disorder: Secondary | ICD-10-CM | POA: Diagnosis not present

## 2019-10-25 DIAGNOSIS — Z130 Encounter for screening for diseases of the blood and blood-forming organs and certain disorders involving the immune mechanism: Secondary | ICD-10-CM | POA: Diagnosis not present

## 2019-10-25 DIAGNOSIS — Z0000 Encounter for general adult medical examination without abnormal findings: Secondary | ICD-10-CM | POA: Diagnosis not present

## 2019-11-06 ENCOUNTER — Ambulatory Visit
Admission: RE | Admit: 2019-11-06 | Discharge: 2019-11-06 | Disposition: A | Discharge status: Home / Self Care | Payer: BLUE CROSS/BLUE SHIELD | Source: Ambulatory Visit | Attending: Obstetrics and Gynecology | Admitting: Obstetrics and Gynecology

## 2019-11-06 ENCOUNTER — Other Ambulatory Visit: Payer: Self-pay

## 2019-11-06 DIAGNOSIS — N6012 Diffuse cystic mastopathy of left breast: Secondary | ICD-10-CM | POA: Diagnosis not present

## 2019-11-06 DIAGNOSIS — R928 Other abnormal and inconclusive findings on diagnostic imaging of breast: Secondary | ICD-10-CM

## 2019-11-06 DIAGNOSIS — R922 Inconclusive mammogram: Secondary | ICD-10-CM | POA: Diagnosis not present

## 2020-05-12 IMAGING — US US BREAST*L* LIMITED INC AXILLA
1 series · 6 of 6 positions shown · non-contrast
Comparison: Previous exam(s).

CLINICAL DATA: Screening recall for a possible left breast mass.

EXAM:
DIGITAL DIAGNOSTIC LEFT MAMMOGRAM WITH CAD AND TOMO
ULTRASOUND LEFT BREAST

[Series 1: us breast*left* limited inc axilla · 0.06mm/px · 6 of 6 slices shown]
[im 1/6]
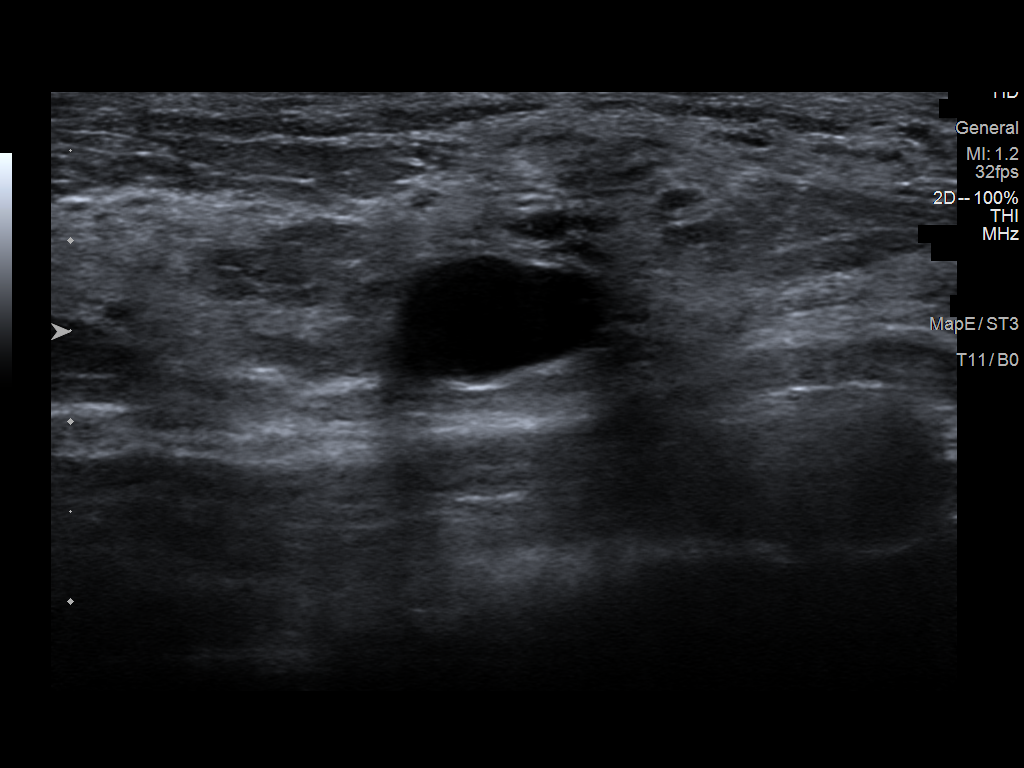
[im 2/6]
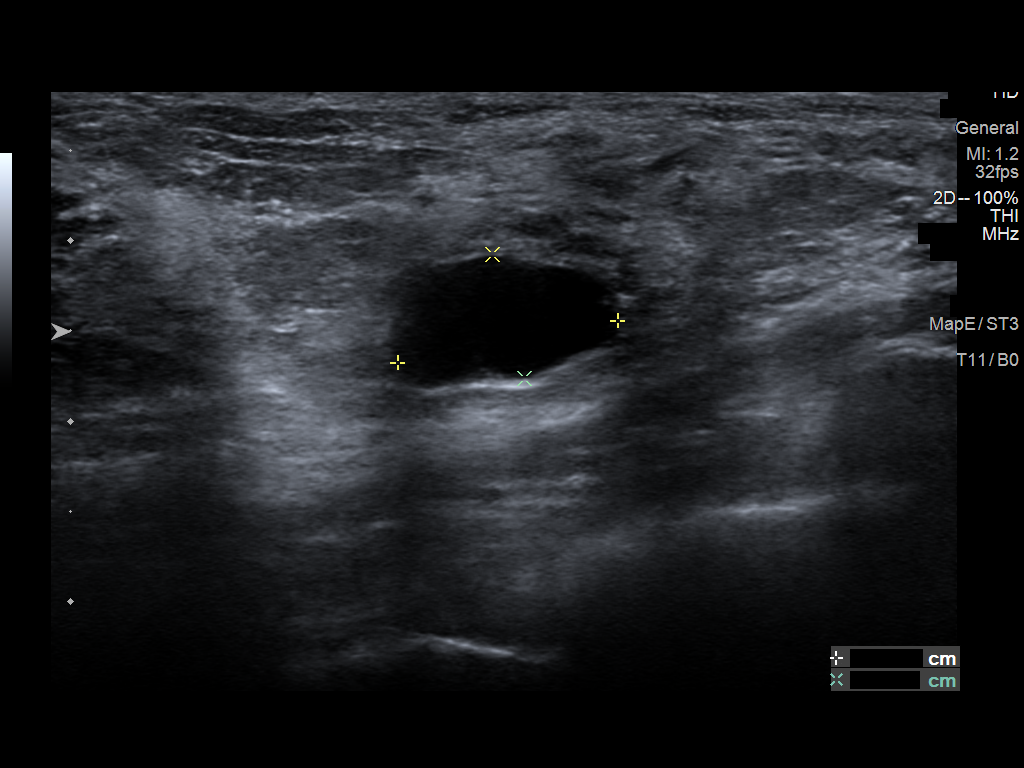
[im 3/6]
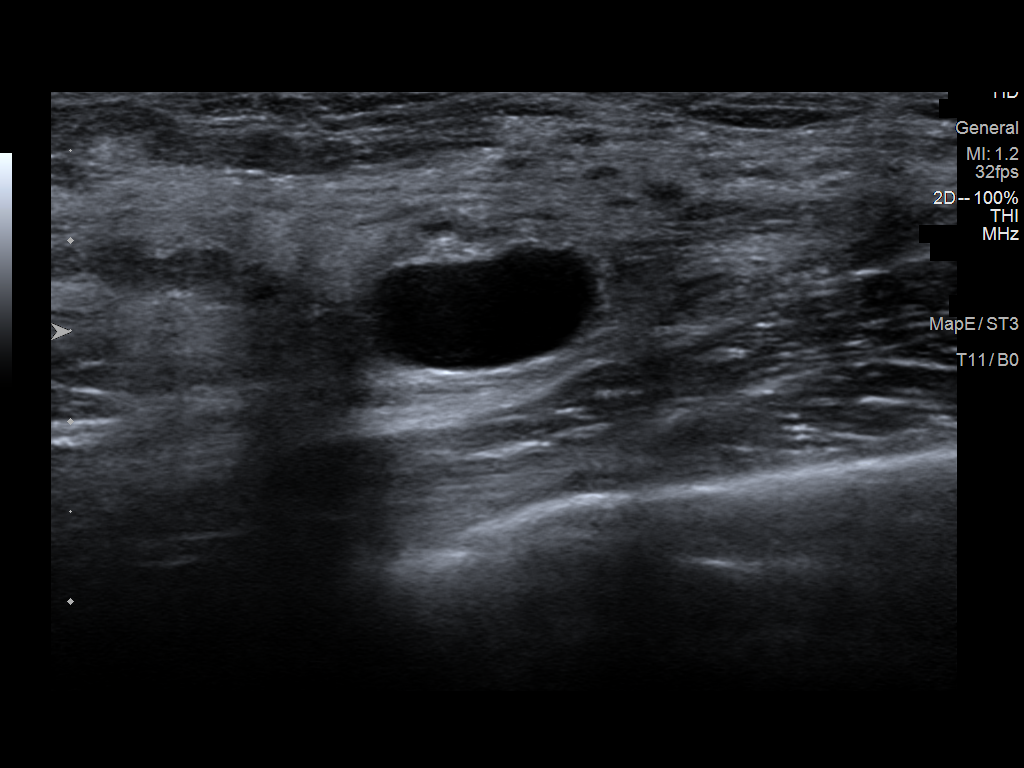
[im 4/6]
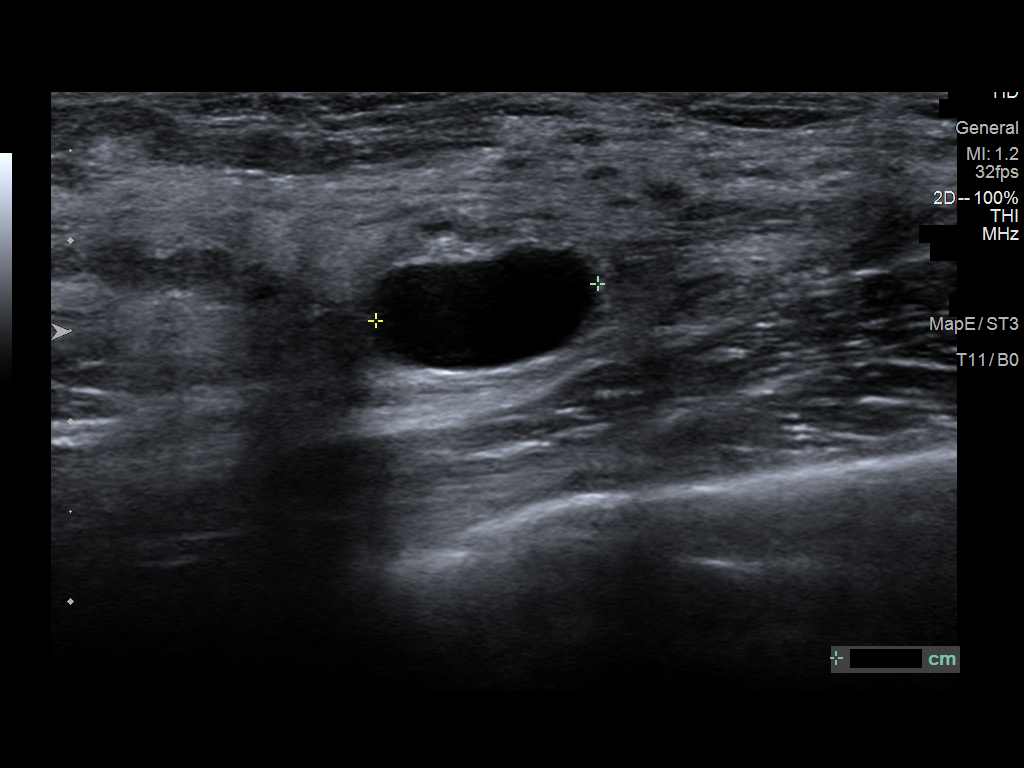
[im 5/6]
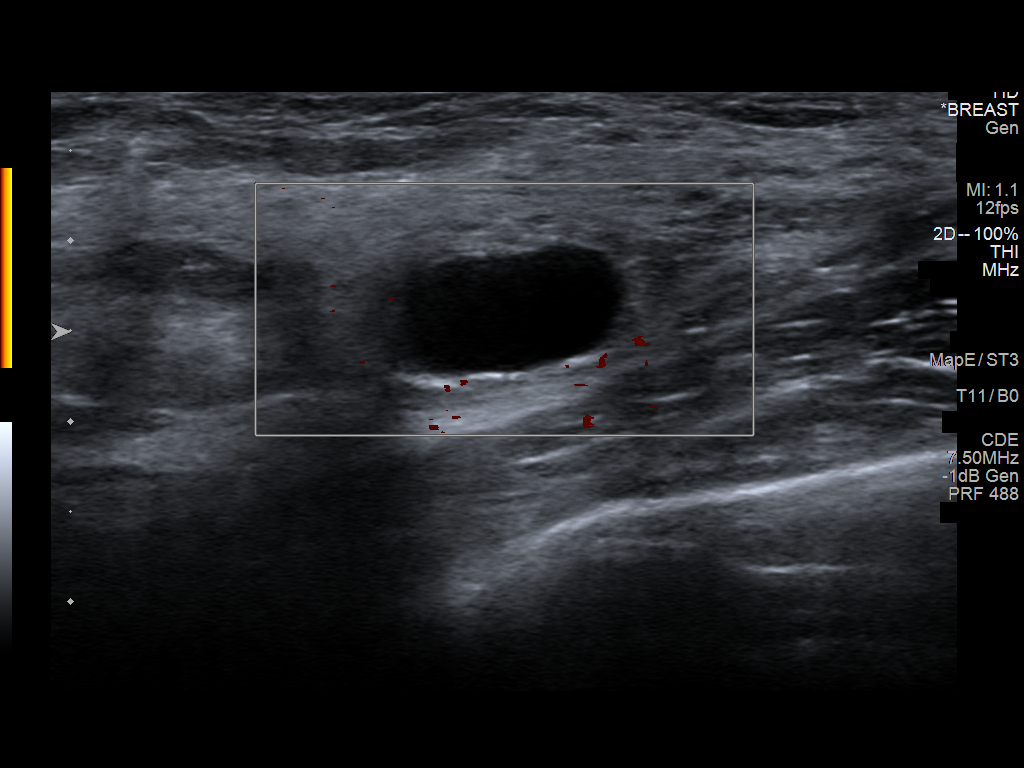
[im 6/6]
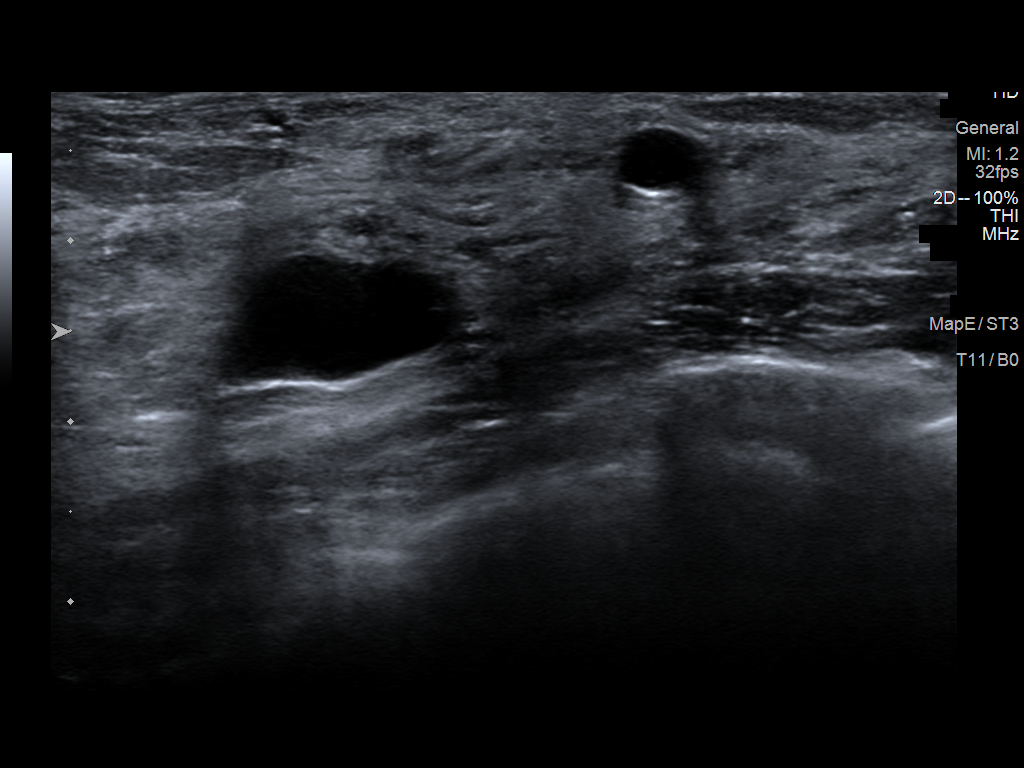

[6 of 6 positions shown; findings below may reference images not displayed]

ACR Breast Density Category c: The breast tissue is heterogeneously
dense, which may obscure small masses.
FINDINGS: The possible mass noted in the upper outer left breast persists on
diagnostic spot compression imaging. It is oval and circumscribed.
There are no other defined masses, no areas of architectural
distortion and no suspicious calcifications.

Mammographic images were processed with CAD.

Targeted ultrasound is performed, showing a simple cyst in the left
breast at 2:30 o'clock, 7 cm from the nipple, measuring 1.3 x 0.7 x
1.2 cm. There is a smaller adjacent simple cyst measuring 4-5 mm. No
solid masses or suspicious lesions.
IMPRESSION: 1. No evidence of breast malignancy.
2. Benign left breast cysts.

RECOMMENDATION:
Screening mammogram in one year.(Code:[id number])

I have discussed the findings and recommendations with the patient.
If applicable, a reminder letter will be sent to the patient
regarding the next appointment.

BI-RADS CATEGORY  2: Benign.

## 2020-11-19 DIAGNOSIS — Z124 Encounter for screening for malignant neoplasm of cervix: Secondary | ICD-10-CM | POA: Diagnosis not present

## 2020-11-19 DIAGNOSIS — Z01419 Encounter for gynecological examination (general) (routine) without abnormal findings: Secondary | ICD-10-CM | POA: Diagnosis not present

## 2020-11-19 DIAGNOSIS — Z1231 Encounter for screening mammogram for malignant neoplasm of breast: Secondary | ICD-10-CM | POA: Diagnosis not present

## 2020-11-19 DIAGNOSIS — Z01411 Encounter for gynecological examination (general) (routine) with abnormal findings: Secondary | ICD-10-CM | POA: Diagnosis not present

## 2020-11-19 DIAGNOSIS — Z113 Encounter for screening for infections with a predominantly sexual mode of transmission: Secondary | ICD-10-CM | POA: Diagnosis not present

## 2020-11-19 DIAGNOSIS — Z6822 Body mass index (BMI) 22.0-22.9, adult: Secondary | ICD-10-CM | POA: Diagnosis not present

## 2020-12-07 DIAGNOSIS — Z1211 Encounter for screening for malignant neoplasm of colon: Secondary | ICD-10-CM | POA: Diagnosis not present

## 2020-12-07 DIAGNOSIS — Z1212 Encounter for screening for malignant neoplasm of rectum: Secondary | ICD-10-CM | POA: Diagnosis not present

## 2020-12-07 LAB — COLOGUARD: Cologuard: NEGATIVE

## 2020-12-19 LAB — COLOGUARD: COLOGUARD: NEGATIVE

## 2021-01-18 DIAGNOSIS — H524 Presbyopia: Secondary | ICD-10-CM | POA: Diagnosis not present

## 2021-01-18 LAB — HM DIABETES EYE EXAM

## 2021-02-17 ENCOUNTER — Other Ambulatory Visit: Payer: Self-pay

## 2021-02-17 ENCOUNTER — Encounter: Payer: Self-pay | Admitting: Family Medicine

## 2021-02-17 ENCOUNTER — Ambulatory Visit (INDEPENDENT_AMBULATORY_CARE_PROVIDER_SITE_OTHER): Payer: BC Managed Care – PPO | Admitting: Family Medicine

## 2021-02-17 VITALS — BP 130/78 | HR 57 | Temp 98.2°F | Resp 16 | Ht 64.0 in | Wt 130.0 lb

## 2021-02-17 DIAGNOSIS — Z131 Encounter for screening for diabetes mellitus: Secondary | ICD-10-CM

## 2021-02-17 DIAGNOSIS — Z13220 Encounter for screening for lipoid disorders: Secondary | ICD-10-CM

## 2021-02-17 DIAGNOSIS — R030 Elevated blood-pressure reading, without diagnosis of hypertension: Secondary | ICD-10-CM | POA: Diagnosis not present

## 2021-02-17 DIAGNOSIS — Z114 Encounter for screening for human immunodeficiency virus [HIV]: Secondary | ICD-10-CM | POA: Diagnosis not present

## 2021-02-17 DIAGNOSIS — Z1159 Encounter for screening for other viral diseases: Secondary | ICD-10-CM

## 2021-02-17 DIAGNOSIS — Z8679 Personal history of other diseases of the circulatory system: Secondary | ICD-10-CM

## 2021-02-17 DIAGNOSIS — Z1329 Encounter for screening for other suspected endocrine disorder: Secondary | ICD-10-CM | POA: Diagnosis not present

## 2021-02-17 LAB — LIPID PANEL
Cholesterol: 235 mg/dL — ABNORMAL HIGH (ref 0–200)
HDL: 79.2 mg/dL (ref >39.00)
LDL Cholesterol: 138 mg/dL — ABNORMAL HIGH (ref 0–99)
NonHDL: 155.91
Total CHOL/HDL Ratio: 3
Triglycerides: 88 mg/dL (ref 0.0–149.0)
VLDL: 17.6 mg/dL (ref 0.0–40.0)

## 2021-02-17 LAB — COMPREHENSIVE METABOLIC PANEL
ALT: 13 U/L (ref 0–35)
AST: 18 U/L (ref 0–37)
Albumin: 5 g/dL (ref 3.5–5.2)
Alkaline Phosphatase: 53 U/L (ref 39–117)
BUN: 8 mg/dL (ref 6–23)
CO2: 29 mEq/L (ref 19–32)
Calcium: 9.8 mg/dL (ref 8.4–10.5)
Chloride: 102 mEq/L (ref 96–112)
Creatinine, Ser: 0.84 mg/dL (ref 0.40–1.20)
GFR: 81.73 mL/min (ref >60.00)
Glucose, Bld: 80 mg/dL (ref 70–99)
Potassium: 4.1 mEq/L (ref 3.5–5.1)
Sodium: 140 mEq/L (ref 135–145)
Total Bilirubin: 0.7 mg/dL (ref 0.2–1.2)
Total Protein: 7.4 g/dL (ref 6.0–8.3)

## 2021-02-17 LAB — TSH: TSH: 3.24 u[IU]/mL (ref 0.35–4.50)

## 2021-02-17 NOTE — Progress Notes (Signed)
Subjective:  Patient ID: Brittany Howard, female    DOB: Jan 01, 1971  Age: 50 y.o. MRN: [id number]  CC:  Chief Complaint  Patient presents with   Establish Care    Pt reports needs help controlling BP readings     HPI Brittany Howard presents for   New patient to me to establish care.  Her husband is also my primary care patient.  History of hyperlipidemia, seasonal allergies, gluten intolerance per past medical history. Daughter [name] is at [facility]. Planning on veterinary medicine.   Elevated blood pressure: No current antihypertensives. Followed by Dr. Dagoberto Reef for OBGYN. Some slight elevation over the years - 138/85 at GYN.  Home readings:140/85-90.  Personal hx of HTN on meds - in 1998-2005 or so. changed diet, job, exercise and has been able to remain off meds.  No added salt to food. Take out food - lunch most days. Dinner - cooks most meals.  Cory Roughen Do  few nights per work. Walking daily and yoga once per week. Weight stable past few years.  BP Readings from Last 3 Encounters:  02/17/21 130/78  09/28/15 122/82  06/11/14 132/82   Hyperlipidemia: By history when much younger.  No prior meds.  Fasting today.  Lab Results  Component Value Date   CHOL 166 04/17/2012   HDL 51 04/17/2012   LDLCALC 99 04/17/2012   TRIG 78 04/17/2012   CHOLHDL 3.3 04/17/2012   Lab Results  Component Value Date   ALT 17 06/13/2013   AST 22 06/13/2013   ALKPHOS 56 06/13/2013   BILITOT 0.2 (L) 06/13/2013   On xiidris for dry eyes, better. followed by optometry.   Health maintenance: Cologuard about a month ago - negative (from [place]).  Tetanus 2019.    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     Allergies  Allergen Reactions   Sulfa Antibiotics Rash   Prior to Admission medications   Medication Sig Start Date End Date Taking? Authorizing Provider  XIIDRA 5 % SOLN Apply 1 drop to eye 2 (two) times daily. 01/21/21   [provider]   Social History   Socioeconomic History   Marital status: Married    Spouse name: Not on file   Number of children: Not on file   Years of education: Not on file   Highest education level: Not on file  Occupational History   Not on file  Tobacco Use   Smoking status: Never   Smokeless tobacco: Not on file  Substance and Sexual Activity   Alcohol use: Yes    Alcohol/week: 2.0 - 3.0 standard drinks    Types: 2 - 3 Glasses of wine per week    Comment: social   Drug use: No   Sexual activity: Yes  Other Topics Concern   Not on file  Social History Narrative   Not on file   Social Determinants of Health   Financial Resource Strain: Not on file  Food Insecurity: Not on file  Transportation Needs: Not on file  Physical Activity: Not on file  Stress: Not on file  Social Connections: Not on file  Intimate Partner Violence: Not on file  Review of Systems  Constitutional:  Negative for fatigue and unexpected weight change.  Respiratory:  Negative for chest tightness and shortness of breath.   Cardiovascular:  Negative for chest pain, palpitations and leg swelling.  Gastrointestinal:  Negative for abdominal pain and blood in stool.  Neurological:  Negative for dizziness, syncope, light-headedness and headaches.    Objective:   Vitals:   02/17/21 0923  BP: 130/78  Pulse: (!) 57  Resp: 16  Temp: 98.2 F (36.8 C)  TempSrc: Temporal  SpO2: 97%  Weight: 130 lb (59 kg)  Height: 5' 4"  (1.626 m)     Physical Exam Vitals reviewed.  Constitutional:      Appearance: Normal appearance. She is well-developed.  HENT:     Head: Normocephalic and atraumatic.  Eyes:     Conjunctiva/sclera: Conjunctivae normal.      Pupils: Pupils are equal, round, and reactive to light.  Neck:     Vascular: No carotid bruit.  Cardiovascular:     Rate and Rhythm: Normal rate and regular rhythm.     Heart sounds: Normal heart sounds.  Pulmonary:     Effort: Pulmonary effort is normal.     Breath sounds: Normal breath sounds.  Abdominal:     Palpations: Abdomen is soft. There is no pulsatile mass.     Tenderness: There is no abdominal tenderness.  Musculoskeletal:     Right lower leg: No edema.     Left lower leg: No edema.  Skin:    General: Skin is warm and dry.  Neurological:     Mental Status: She is alert and oriented to person, place, and time.  Psychiatric:        Mood and Affect: Mood normal.        Behavior: Behavior normal.       Assessment & Plan:  [name] is a 50 y.o. female . Elevated blood pressure reading - Plan: Comprehensive metabolic panel, TSH  History of hypertension - Plan: Comprehensive metabolic panel, TSH  Screening for hyperlipidemia - Plan: Lipid panel  Screening for thyroid disorder - Plan: TSH  Encounter for hepatitis C screening test for low risk patient - Plan: Hepatitis C antibody  Encounter for screening for HIV - Plan: HIV Antibody (routine testing w rflx)  Screening for diabetes mellitus - Plan: Comprehensive metabolic panel   History of hypertension, many years ago, improved after diet, job changes, exercise.  Still exercising.  Some possible room for improvement for meals at lunch and takeout.  Handout given on the salty 6 higher sodium foods.  Handout given on managing hypertension as well as home monitoring of blood pressure.  If persistent readings above 130/80, could consider medication but does not appear to be needed at this time.  Check TSH, CMP, lipid panel  Previous history of hyperlipidemia, most recent testing in 2013 was normal.  Repeat testing.  HIV, hepatitis C screening tests obtained.  Asymptomatic.  No orders of the defined types were  placed in this encounter.  Patient Instructions  Blood pressure looks okay here today.  See information below on managing high blood pressure as well as appropriate measurement of high blood pressure.  Can also refer to the handout on the "salty six" or some common foods that may be high in sodium.  If you consistently have readings above 130/80 at home, follow-up and we can discuss medications but I do not think those are necessary at this time.  Follow-up in 6 months for a physical  but let me know if there are questions sooner.  Thanks for coming in today!  How to Take Your Blood Pressure Blood pressure is a measurement of how strongly your blood is pressing against the walls of your arteries. Arteries are blood vessels that carry blood from your heart throughout your body. Your health care provider takes your blood pressure at each office visit. You can also take your own blood pressure athome with a blood pressure monitor. You may need to take your own blood pressure to: Confirm a diagnosis of high blood pressure (hypertension). Monitor your blood pressure over time. Make sure your blood pressure medicine is working. Supplies needed: Blood pressure monitor. Dining room chair to sit in. Table or desk. Small notebook and pencil or pen. How to prepare To get the most accurate reading, avoid the following for 30 minutes before you check your blood pressure: Drinking caffeine. Drinking alcohol. Eating. Smoking. Exercising. Five minutes before you check your blood pressure: Use the bathroom and urinate so that you have an empty bladder. Sit quietly in a dining room chair. Do not sit in a soft couch or an armchair. Do not talk. How to take your blood pressure To check your blood pressure, follow the instructions in the manual that came with your blood pressure monitor. If you have a digital blood pressure monitor, the instructions may be as follows: Sit up straight in a chair. Place your  feet on the floor. Do not cross your ankles or legs. Rest your left arm at the level of your heart on a table or desk or on the arm of a chair. Pull up your shirt sleeve. Wrap the blood pressure cuff around the upper part of your left arm, 1 inch (2.5 cm) above your elbow. It is best to wrap the cuff around bare skin. Fit the cuff snugly around your arm. You should be able to place only one finger between the cuff and your arm. Position the cord so that it rests in the bend of your elbow. Press the power button. Sit quietly while the cuff inflates and deflates. Read the digital reading on the monitor screen and write the numbers down (record them) in a notebook. Wait 2-3 minutes, then repeat the steps, starting at step 1. What does my blood pressure reading mean? A blood pressure reading consists of a higher number over a lower number. Ideally, your blood pressure should be below 120/80. The first ("top") number is called the systolic pressure. It is a measure of the pressure in your arteries as your heart beats. The second ("bottom") number is called the diastolic pressure. It is a measure of the pressure in your arteries as theheart relaxes. Blood pressure is classified into five stages. The following are the stages for adults who do not have a short-term serious illness or a chronic condition. Systolic pressure and diastolic pressure are measured in a unit called mm Hg (millimeters of mercury). Normal Systolic pressure: below 628. Diastolic pressure: below 80. Elevated Systolic pressure: 366-294. Diastolic pressure: below 80. Hypertension stage 1 Systolic pressure: 765-465. Diastolic pressure: 03-54. Hypertension stage 2 Systolic pressure: 656 or above. Diastolic pressure: 90 or above. You can have elevated blood pressure or hypertension even if only the systolicor only the diastolic number in your reading is higher than normal. Follow these instructions at home: Medicines Take  over-the-counter and prescription medicines only as told by your health care provider. Tell your health care provider if you are having any side effects  from blood pressure medicine. General instructions Check your blood pressure as often as recommended by your health care provider. Check your blood pressure at the same time every day. Take your monitor to the next appointment with your health care provider to make sure that: You are using it correctly. It provides accurate readings. Understand what your goal blood pressure numbers are. Keep all follow-up visits as told by your health care provider. This is important. General tips Your health care provider can suggest a reliable monitor that will meet your needs. There are several types of home blood pressure monitors. Choose a monitor that has an arm cuff. Do not choose a monitor that measures your blood pressure from your wrist or finger. Choose a cuff that wraps snugly around your upper arm. You should be able to fit only one finger between your arm and the cuff. You can buy a blood pressure monitor at most drugstores or online. Where to find more information American Heart Association: www.heart.org Contact a health care provider if: Your blood pressure is consistently high. Your blood pressure is suddenly low. Get help right away if: Your systolic blood pressure is higher than 180. Your diastolic blood pressure is higher than 120. Summary Blood pressure is a measurement of how strongly your blood is pressing against the walls of your arteries. A blood pressure reading consists of a higher number over a lower number. Ideally, your blood pressure should be below 120/80. Check your blood pressure at the same time every day. Avoid caffeine, alcohol, smoking, and exercise for 30 minutes prior to checking your blood pressure. These agents can affect the accuracy of the blood pressure reading. This information is not intended to replace  advice given to you by your health care provider. Make sure you discuss any questions you have with your healthcare provider. Document Revised: 06/24/2020 Document Reviewed: 08/09/2019 Elsevier Patient Education  2022 [place] Your Hypertension Hypertension, also called high blood pressure, is when the force of the blood pressing against the walls of the arteries is too strong. Arteries are blood vessels that carry blood from your heart throughout your body. Hypertension forces the heart to work harder to pump blood and may cause the arteries tobecome narrow or stiff. Understanding blood pressure readings Your personal target blood pressure may vary depending on your medical conditions, your age, and other factors. A blood pressure reading includes a higher number over a lower number. Ideally, your blood pressure should be below 120/80. You should know that: The first, or top, number is called the systolic pressure. It is a measure of the pressure in your arteries as your heart beats. The second, or bottom number, is called the diastolic pressure. It is a measure of the pressure in your arteries as the heart relaxes. Blood pressure is classified into four stages. Based on your blood pressure reading, your health care provider may use the following stages to determine what type of treatment you need, if any. Systolic pressure and diastolicpressure are measured in a unit called mmHg. Normal Systolic pressure: below 948. Diastolic pressure: below 80. Elevated Systolic pressure: 016-553. Diastolic pressure: below 80. Hypertension stage 1 Systolic pressure: 748-270. Diastolic pressure: 78-67. Hypertension stage 2 Systolic pressure: 544 or above. Diastolic pressure: 90 or above. How can this condition affect me? Managing your hypertension is an important responsibility. Over time, hypertension can damage the arteries and decrease blood flow to important parts of the body, including  the brain, heart, and kidneys. Having untreated or  uncontrolled hypertension can lead to: A heart attack. A stroke. A weakened blood vessel (aneurysm). Heart failure. Kidney damage. Eye damage. Metabolic syndrome. Memory and concentration problems. Vascular dementia. What actions can I take to manage this condition? Hypertension can be managed by making lifestyle changes and possibly by taking medicines. Your health care provider will help you make a plan to bring yourblood pressure within a normal range. Nutrition  Eat a diet that is high in fiber and potassium, and low in salt (sodium), added sugar, and fat. An example eating plan is called the Dietary Approaches to Stop Hypertension (DASH) diet. To eat this way: Eat plenty of fresh fruits and vegetables. Try to fill one-half of your plate at each meal with fruits and vegetables. Eat whole grains, such as whole-wheat pasta, brown rice, or whole-grain bread. Fill about one-fourth of your plate with whole grains. Eat low-fat dairy products. Avoid fatty cuts of meat, processed or cured meats, and poultry with skin. Fill about one-fourth of your plate with lean proteins such as fish, chicken without skin, beans, eggs, and tofu. Avoid pre-made and processed foods. These tend to be higher in sodium, added sugar, and fat. Reduce your daily sodium intake. Most people with hypertension should eat less than 1,500 mg of sodium a day.  Lifestyle  Work with your health care provider to maintain a healthy body weight or to lose weight. Ask what an ideal weight is for you. Get at least 30 minutes of exercise that causes your heart to beat faster (aerobic exercise) most days of the week. Activities may include walking, swimming, or biking. Include exercise to strengthen your muscles (resistance exercise), such as weight lifting, as part of your weekly exercise routine. Try to do these types of exercises for 30 minutes at least 3 days a week. Do not use  any products that contain nicotine or tobacco, such as cigarettes, e-cigarettes, and chewing tobacco. If you need help quitting, ask your health care provider. Control any long-term (chronic) conditions you have, such as high cholesterol or diabetes. Identify your sources of stress and find ways to manage stress. This may include meditation, deep breathing, or making time for fun activities.  Alcohol use Do not drink alcohol if: Your health care provider tells you not to drink. You are pregnant, may be pregnant, or are planning to become pregnant. If you drink alcohol: Limit how much you use to: 0-1 drink a day for women. 0-2 drinks a day for men. Be aware of how much alcohol is in your drink. In the [place] one drink equals one 12 oz bottle of beer (355 mL), one 5 oz glass of wine (148 mL), or one 1 oz glass of hard liquor (44 mL). Medicines Your health care provider may prescribe medicine if lifestyle changes are not enough to get your blood pressure under control and if: Your systolic blood pressure is 130 or higher. Your diastolic blood pressure is 80 or higher. Take medicines only as told by your health care provider. Follow the directions carefully. Blood pressure medicines must be taken as told by your health care provider. The medicine does not work as well when you skip doses. Skippingdoses also puts you at risk for problems. Monitoring Before you monitor your blood pressure: Do not smoke, drink caffeinated beverages, or exercise within 30 minutes before taking a measurement. Use the bathroom and empty your bladder (urinate). Sit quietly for at least 5 minutes before taking measurements. Monitor your blood pressure at home as  told by your health care provider. To do this: Sit with your back straight and supported. Place your feet flat on the floor. Do not cross your legs. Support your arm on a flat surface, such as a table. Make sure your upper arm is at heart level. Each time you  measure, take two or three readings one minute apart and record the results. You may also need to have your blood pressure checked regularly by your healthcare provider. General information Talk with your health care provider about your diet, exercise habits, and other lifestyle factors that may be contributing to hypertension. Review all the medicines you take with your health care provider because there may be side effects or interactions. Keep all visits as told by your health care provider. Your health care provider can help you create and adjust your plan for managing your high blood pressure. Where to find more information National Heart, Lung, and Blood Institute: https://wilson-eaton.com/ American Heart Association: www.heart.org Contact a health care provider if: You think you are having a reaction to medicines you have taken. You have repeated (recurrent) headaches. You feel dizzy. You have swelling in your ankles. You have trouble with your vision. Get help right away if: You develop a severe headache or confusion. You have unusual weakness or numbness, or you feel faint. You have severe pain in your chest or abdomen. You vomit repeatedly. You have trouble breathing. These symptoms may represent a serious problem that is an emergency. Do not wait to see if the symptoms will go away. Get medical help right away. Call your local emergency services (911 in the [place] Do not drive yourself to the hospital. Summary Hypertension is when the force of blood pumping through your arteries is too strong. If this condition is not controlled, it may put you at risk for serious complications. Your personal target blood pressure may vary depending on your medical conditions, your age, and other factors. For most people, a normal blood pressure is less than 120/80. Hypertension is managed by lifestyle changes, medicines, or both. Lifestyle changes to help manage hypertension include losing weight,  eating a healthy, low-sodium diet, exercising more, stopping smoking, and limiting alcohol. This information is not intended to replace advice given to you by your health care provider. Make sure you discuss any questions you have with your healthcare provider. Document Revised: 09/20/2019 Document Reviewed: 07/16/2019 Elsevier Patient Education  2022 [place],   Merri Ray, MD [place] 02/17/21 10:21 AM

## 2021-02-17 NOTE — Patient Instructions (Signed)
Blood pressure looks okay here today.  See information below on managing high blood pressure as well as appropriate measurement of high blood pressure.  Can also refer to the handout on the "salty six" or some common foods that may be high in sodium.  If you consistently have readings above 130/80 at home, follow-up and we can discuss medications but I do not think those are necessary at this time.  Follow-up in 6 months for a physical but let me know if there are questions sooner.  Thanks for coming in today!  How to Take Your Blood Pressure Blood pressure is a measurement of how strongly your blood is pressing against the walls of your arteries. Arteries are blood vessels that carry blood from your heart throughout your body. Your health care provider takes your blood pressure at each office visit. You can also take your own blood pressure athome with a blood pressure monitor. You may need to take your own blood pressure to: Confirm a diagnosis of high blood pressure (hypertension). Monitor your blood pressure over time. Make sure your blood pressure medicine is working. Supplies needed: Blood pressure monitor. Dining room chair to sit in. Table or desk. Small notebook and pencil or pen. How to prepare To get the most accurate reading, avoid the following for 30 minutes before you check your blood pressure: Drinking caffeine. Drinking alcohol. Eating. Smoking. Exercising. Five minutes before you check your blood pressure: Use the bathroom and urinate so that you have an empty bladder. Sit quietly in a dining room chair. Do not sit in a soft couch or an armchair. Do not talk. How to take your blood pressure To check your blood pressure, follow the instructions in the manual that came with your blood pressure monitor. If you have a digital blood pressure monitor, the instructions may be as follows: Sit up straight in a chair. Place your feet on the floor. Do not cross your ankles or  legs. Rest your left arm at the level of your heart on a table or desk or on the arm of a chair. Pull up your shirt sleeve. Wrap the blood pressure cuff around the upper part of your left arm, 1 inch (2.5 cm) above your elbow. It is best to wrap the cuff around bare skin. Fit the cuff snugly around your arm. You should be able to place only one finger between the cuff and your arm. Position the cord so that it rests in the bend of your elbow. Press the power button. Sit quietly while the cuff inflates and deflates. Read the digital reading on the monitor screen and write the numbers down (record them) in a notebook. Wait 2-3 minutes, then repeat the steps, starting at step 1. What does my blood pressure reading mean? A blood pressure reading consists of a higher number over a lower number. Ideally, your blood pressure should be below 120/80. The first ("top") number is called the systolic pressure. It is a measure of the pressure in your arteries as your heart beats. The second ("bottom") number is called the diastolic pressure. It is a measure of the pressure in your arteries as theheart relaxes. Blood pressure is classified into five stages. The following are the stages for adults who do not have a short-term serious illness or a chronic condition. Systolic pressure and diastolic pressure are measured in a unit called mm Hg (millimeters of mercury). Normal Systolic pressure: below 001. Diastolic pressure: below 80. Elevated Systolic pressure: 749-449. Diastolic pressure:  below 80. Hypertension stage 1 Systolic pressure: 720-947. Diastolic pressure: 09-62. Hypertension stage 2 Systolic pressure: 836 or above. Diastolic pressure: 90 or above. You can have elevated blood pressure or hypertension even if only the systolicor only the diastolic number in your reading is higher than normal. Follow these instructions at home: Medicines Take over-the-counter and prescription medicines only as  told by your health care provider. Tell your health care provider if you are having any side effects from blood pressure medicine. General instructions Check your blood pressure as often as recommended by your health care provider. Check your blood pressure at the same time every day. Take your monitor to the next appointment with your health care provider to make sure that: You are using it correctly. It provides accurate readings. Understand what your goal blood pressure numbers are. Keep all follow-up visits as told by your health care provider. This is important. General tips Your health care provider can suggest a reliable monitor that will meet your needs. There are several types of home blood pressure monitors. Choose a monitor that has an arm cuff. Do not choose a monitor that measures your blood pressure from your wrist or finger. Choose a cuff that wraps snugly around your upper arm. You should be able to fit only one finger between your arm and the cuff. You can buy a blood pressure monitor at most drugstores or online. Where to find more information American Heart Association: www.heart.org Contact a health care provider if: Your blood pressure is consistently high. Your blood pressure is suddenly low. Get help right away if: Your systolic blood pressure is higher than 180. Your diastolic blood pressure is higher than 120. Summary Blood pressure is a measurement of how strongly your blood is pressing against the walls of your arteries. A blood pressure reading consists of a higher number over a lower number. Ideally, your blood pressure should be below 120/80. Check your blood pressure at the same time every day. Avoid caffeine, alcohol, smoking, and exercise for 30 minutes prior to checking your blood pressure. These agents can affect the accuracy of the blood pressure reading. This information is not intended to replace advice given to you by your health care provider. Make  sure you discuss any questions you have with your healthcare provider. Document Revised: 06/24/2020 Document Reviewed: 08/09/2019 Elsevier Patient Education  2022 [employer] Your Hypertension Hypertension, also called high blood pressure, is when the force of the blood pressing against the walls of the arteries is too strong. Arteries are blood vessels that carry blood from your heart throughout your body. Hypertension forces the heart to work harder to pump blood and may cause the arteries tobecome narrow or stiff. Understanding blood pressure readings Your personal target blood pressure may vary depending on your medical conditions, your age, and other factors. A blood pressure reading includes a higher number over a lower number. Ideally, your blood pressure should be below 120/80. You should know that: The first, or top, number is called the systolic pressure. It is a measure of the pressure in your arteries as your heart beats. The second, or bottom number, is called the diastolic pressure. It is a measure of the pressure in your arteries as the heart relaxes. Blood pressure is classified into four stages. Based on your blood pressure reading, your health care provider may use the following stages to determine what type of treatment you need, if any. Systolic pressure and diastolicpressure are measured in a unit called  mmHg. Normal Systolic pressure: below 237. Diastolic pressure: below 80. Elevated Systolic pressure: 628-315. Diastolic pressure: below 80. Hypertension stage 1 Systolic pressure: 176-160. Diastolic pressure: 73-71. Hypertension stage 2 Systolic pressure: 062 or above. Diastolic pressure: 90 or above. How can this condition affect me? Managing your hypertension is an important responsibility. Over time, hypertension can damage the arteries and decrease blood flow to important parts of the body, including the brain, heart, and kidneys. Having untreated or  uncontrolled hypertension can lead to: A heart attack. A stroke. A weakened blood vessel (aneurysm). Heart failure. Kidney damage. Eye damage. Metabolic syndrome. Memory and concentration problems. Vascular dementia. What actions can I take to manage this condition? Hypertension can be managed by making lifestyle changes and possibly by taking medicines. Your health care provider will help you make a plan to bring yourblood pressure within a normal range. Nutrition  Eat a diet that is high in fiber and potassium, and low in salt (sodium), added sugar, and fat. An example eating plan is called the Dietary Approaches to Stop Hypertension (DASH) diet. To eat this way: Eat plenty of fresh fruits and vegetables. Try to fill one-half of your plate at each meal with fruits and vegetables. Eat whole grains, such as whole-wheat pasta, brown rice, or whole-grain bread. Fill about one-fourth of your plate with whole grains. Eat low-fat dairy products. Avoid fatty cuts of meat, processed or cured meats, and poultry with skin. Fill about one-fourth of your plate with lean proteins such as fish, chicken without skin, beans, eggs, and tofu. Avoid pre-made and processed foods. These tend to be higher in sodium, added sugar, and fat. Reduce your daily sodium intake. Most people with hypertension should eat less than 1,500 mg of sodium a day.  Lifestyle  Work with your health care provider to maintain a healthy body weight or to lose weight. Ask what an ideal weight is for you. Get at least 30 minutes of exercise that causes your heart to beat faster (aerobic exercise) most days of the week. Activities may include walking, swimming, or biking. Include exercise to strengthen your muscles (resistance exercise), such as weight lifting, as part of your weekly exercise routine. Try to do these types of exercises for 30 minutes at least 3 days a week. Do not use any products that contain nicotine or tobacco, such  as cigarettes, e-cigarettes, and chewing tobacco. If you need help quitting, ask your health care provider. Control any long-term (chronic) conditions you have, such as high cholesterol or diabetes. Identify your sources of stress and find ways to manage stress. This may include meditation, deep breathing, or making time for fun activities.  Alcohol use Do not drink alcohol if: Your health care provider tells you not to drink. You are pregnant, may be pregnant, or are planning to become pregnant. If you drink alcohol: Limit how much you use to: 0-1 drink a day for women. 0-2 drinks a day for men. Be aware of how much alcohol is in your drink. In the [place] one drink equals one 12 oz bottle of beer (355 mL), one 5 oz glass of wine (148 mL), or one 1 oz glass of hard liquor (44 mL). Medicines Your health care provider may prescribe medicine if lifestyle changes are not enough to get your blood pressure under control and if: Your systolic blood pressure is 130 or higher. Your diastolic blood pressure is 80 or higher. Take medicines only as told by your health care provider. Follow the directions  carefully. Blood pressure medicines must be taken as told by your health care provider. The medicine does not work as well when you skip doses. Skippingdoses also puts you at risk for problems. Monitoring Before you monitor your blood pressure: Do not smoke, drink caffeinated beverages, or exercise within 30 minutes before taking a measurement. Use the bathroom and empty your bladder (urinate). Sit quietly for at least 5 minutes before taking measurements. Monitor your blood pressure at home as told by your health care provider. To do this: Sit with your back straight and supported. Place your feet flat on the floor. Do not cross your legs. Support your arm on a flat surface, such as a table. Make sure your upper arm is at heart level. Each time you measure, take two or three readings one minute apart  and record the results. You may also need to have your blood pressure checked regularly by your healthcare provider. General information Talk with your health care provider about your diet, exercise habits, and other lifestyle factors that may be contributing to hypertension. Review all the medicines you take with your health care provider because there may be side effects or interactions. Keep all visits as told by your health care provider. Your health care provider can help you create and adjust your plan for managing your high blood pressure. Where to find more information National Heart, Lung, and Blood Institute: https://wilson-eaton.com/ American Heart Association: www.heart.org Contact a health care provider if: You think you are having a reaction to medicines you have taken. You have repeated (recurrent) headaches. You feel dizzy. You have swelling in your ankles. You have trouble with your vision. Get help right away if: You develop a severe headache or confusion. You have unusual weakness or numbness, or you feel faint. You have severe pain in your chest or abdomen. You vomit repeatedly. You have trouble breathing. These symptoms may represent a serious problem that is an emergency. Do not wait to see if the symptoms will go away. Get medical help right away. Call your local emergency services (911 in the [place] Do not drive yourself to the hospital. Summary Hypertension is when the force of blood pumping through your arteries is too strong. If this condition is not controlled, it may put you at risk for serious complications. Your personal target blood pressure may vary depending on your medical conditions, your age, and other factors. For most people, a normal blood pressure is less than 120/80. Hypertension is managed by lifestyle changes, medicines, or both. Lifestyle changes to help manage hypertension include losing weight, eating a healthy, low-sodium diet, exercising more,  stopping smoking, and limiting alcohol. This information is not intended to replace advice given to you by your health care provider. Make sure you discuss any questions you have with your healthcare provider. Document Revised: 09/20/2019 Document Reviewed: 07/16/2019 Elsevier Patient Education  2022 [employer].

## 2021-02-18 LAB — HEPATITIS C ANTIBODY
Hepatitis C Ab: NONREACTIVE
SIGNAL TO CUT-OFF: 0.01 (ref <1.00)

## 2021-02-18 LAB — HIV ANTIBODY (ROUTINE TESTING W REFLEX): HIV 1&2 Ab, 4th Generation: NONREACTIVE

## 2021-06-02 DIAGNOSIS — H04123 Dry eye syndrome of bilateral lacrimal glands: Secondary | ICD-10-CM | POA: Diagnosis not present

## 2021-08-11 ENCOUNTER — Encounter: Payer: BC Managed Care – PPO | Admitting: Family Medicine

## 2021-08-11 DIAGNOSIS — Z8679 Personal history of other diseases of the circulatory system: Secondary | ICD-10-CM

## 2021-08-11 DIAGNOSIS — Z1329 Encounter for screening for other suspected endocrine disorder: Secondary | ICD-10-CM

## 2021-08-11 DIAGNOSIS — Z130 Encounter for screening for diseases of the blood and blood-forming organs and certain disorders involving the immune mechanism: Secondary | ICD-10-CM

## 2021-08-11 DIAGNOSIS — Z131 Encounter for screening for diabetes mellitus: Secondary | ICD-10-CM

## 2021-08-11 DIAGNOSIS — Z13220 Encounter for screening for lipoid disorders: Secondary | ICD-10-CM

## 2021-11-03 ENCOUNTER — Ambulatory Visit (INDEPENDENT_AMBULATORY_CARE_PROVIDER_SITE_OTHER): Payer: BC Managed Care – PPO | Admitting: Family Medicine

## 2021-11-03 ENCOUNTER — Encounter: Payer: Self-pay | Admitting: Family Medicine

## 2021-11-03 VITALS — BP 160/90 | HR 58 | Temp 97.9°F | Resp 17 | Ht 64.0 in | Wt 132.8 lb

## 2021-11-03 DIAGNOSIS — Z130 Encounter for screening for diseases of the blood and blood-forming organs and certain disorders involving the immune mechanism: Secondary | ICD-10-CM | POA: Diagnosis not present

## 2021-11-03 DIAGNOSIS — Z0000 Encounter for general adult medical examination without abnormal findings: Secondary | ICD-10-CM | POA: Diagnosis not present

## 2021-11-03 DIAGNOSIS — Z131 Encounter for screening for diabetes mellitus: Secondary | ICD-10-CM

## 2021-11-03 DIAGNOSIS — I10 Essential (primary) hypertension: Secondary | ICD-10-CM | POA: Diagnosis not present

## 2021-11-03 DIAGNOSIS — Z23 Encounter for immunization: Secondary | ICD-10-CM

## 2021-11-03 DIAGNOSIS — R0600 Dyspnea, unspecified: Secondary | ICD-10-CM

## 2021-11-03 DIAGNOSIS — Z13220 Encounter for screening for lipoid disorders: Secondary | ICD-10-CM

## 2021-11-03 MED ORDER — AMLODIPINE BESYLATE 5 MG PO TABS: 5.0000 mg | ORAL_TABLET | Freq: Every day | ORAL | 1 refills | Status: DC

## 2021-11-03 NOTE — Patient Instructions (Addendum)
Start amlodipine once per day for blood pressure.  Watch for lightheadedness or dizziness or too low of readings and if that occurs let me know.  Recheck in 1 month but let me know if there are questions sooner.  Lab visit in the next week if possible.   Occasionally on/shortness of breath at rest is not concerning but if you have any shortness of breath with exertion or other new symptoms with exercise be seen right away.  Thanks for coming in today.  Let me know if there are questions.  Managing Your Hypertension Hypertension, also called high blood pressure, is when the force of the blood pressing against the walls of the arteries is too strong. Arteries are blood vessels that carry blood from your heart throughout your body. Hypertension forces the heart to work harder to pump blood and may cause the arteries to become narrow or stiff. Understanding blood pressure readings Your personal target blood pressure may vary depending on your medical conditions, your age, and other factors. A blood pressure reading includes a higher number over a lower number. Ideally, your blood pressure should be below 120/80. You should know that: The first, or top, number is called the systolic pressure. It is a measure of the pressure in your arteries as your heart beats. The second, or bottom number, is called the diastolic pressure. It is a measure of the pressure in your arteries as the heart relaxes. Blood pressure is classified into four stages. Based on your blood pressure reading, your health care provider may use the following stages to determine what type of treatment you need, if any. Systolic pressure and diastolic pressure are measured in a unit called mmHg. Normal Systolic pressure: below 841. Diastolic pressure: below 80. Elevated Systolic pressure: 660-630. Diastolic pressure: below 80. Hypertension stage 1 Systolic pressure: 160-109. Diastolic pressure: 32-35. Hypertension stage 2 Systolic  pressure: 573 or above. Diastolic pressure: 90 or above. How can this condition affect me? Managing your hypertension is an important responsibility. Over time, hypertension can damage the arteries and decrease blood flow to important parts of the body, including the brain, heart, and kidneys. Having untreated or uncontrolled hypertension can lead to: A heart attack. A stroke. A weakened blood vessel (aneurysm). Heart failure. Kidney damage. Eye damage. Metabolic syndrome. Memory and concentration problems. Vascular dementia. What actions can I take to manage this condition? Hypertension can be managed by making lifestyle changes and possibly by taking medicines. Your health care provider will help you make a plan to bring your blood pressure within a normal range. Nutrition  Eat a diet that is high in fiber and potassium, and low in salt (sodium), added sugar, and fat. An example eating plan is called the Dietary Approaches to Stop Hypertension (DASH) diet. To eat this way: Eat plenty of fresh fruits and vegetables. Try to fill one-half of your plate at each meal with fruits and vegetables. Eat whole grains, such as whole-wheat pasta, brown rice, or whole-grain bread. Fill about one-fourth of your plate with whole grains. Eat low-fat dairy products. Avoid fatty cuts of meat, processed or cured meats, and poultry with skin. Fill about one-fourth of your plate with lean proteins such as fish, chicken without skin, beans, eggs, and tofu. Avoid pre-made and processed foods. These tend to be higher in sodium, added sugar, and fat. Reduce your daily sodium intake. Most people with hypertension should eat less than 1,500 mg of sodium a day. Lifestyle  Work with your health care provider  to maintain a healthy body weight or to lose weight. Ask what an ideal weight is for you. Get at least 30 minutes of exercise that causes your heart to beat faster (aerobic exercise) most days of the week.  Activities may include walking, swimming, or biking. Include exercise to strengthen your muscles (resistance exercise), such as weight lifting, as part of your weekly exercise routine. Try to do these types of exercises for 30 minutes at least 3 days a week. Do not use any products that contain nicotine or tobacco, such as cigarettes, e-cigarettes, and chewing tobacco. If you need help quitting, ask your health care provider. Control any long-term (chronic) conditions you have, such as high cholesterol or diabetes. Identify your sources of stress and find ways to manage stress. This may include meditation, deep breathing, or making time for fun activities. Alcohol use Do not drink alcohol if: Your health care provider tells you not to drink. You are pregnant, may be pregnant, or are planning to become pregnant. If you drink alcohol: Limit how much you use to: 0-1 drink a day for women. 0-2 drinks a day for men. Be aware of how much alcohol is in your drink. In the [place] one drink equals one 12 oz bottle of beer (355 mL), one 5 oz glass of wine (148 mL), or one 1 oz glass of hard liquor (44 mL). Medicines Your health care provider may prescribe medicine if lifestyle changes are not enough to get your blood pressure under control and if: Your systolic blood pressure is 130 or higher. Your diastolic blood pressure is 80 or higher. Take medicines only as told by your health care provider. Follow the directions carefully. Blood pressure medicines must be taken as told by your health care provider. The medicine does not work as well when you skip doses. Skipping doses also puts you at risk for problems. Monitoring Before you monitor your blood pressure: Do not smoke, drink caffeinated beverages, or exercise within 30 minutes before taking a measurement. Use the bathroom and empty your bladder (urinate). Sit quietly for at least 5 minutes before taking measurements. Monitor your blood pressure at  home as told by your health care provider. To do this: Sit with your back straight and supported. Place your feet flat on the floor. Do not cross your legs. Support your arm on a flat surface, such as a table. Make sure your upper arm is at heart level. Each time you measure, take two or three readings one minute apart and record the results. You may also need to have your blood pressure checked regularly by your health care provider. General information Talk with your health care provider about your diet, exercise habits, and other lifestyle factors that may be contributing to hypertension. Review all the medicines you take with your health care provider because there may be side effects or interactions. Keep all visits as told by your health care provider. Your health care provider can help you create and adjust your plan for managing your high blood pressure. Where to find more information National Heart, Lung, and Blood Institute: https://wilson-eaton.com/ American Heart Association: www.heart.org Contact a health care provider if: You think you are having a reaction to medicines you have taken. You have repeated (recurrent) headaches. You feel dizzy. You have swelling in your ankles. You have trouble with your vision. Get help right away if: You develop a severe headache or confusion. You have unusual weakness or numbness, or you feel faint. You have severe  pain in your chest or abdomen. You vomit repeatedly. You have trouble breathing. These symptoms may represent a serious problem that is an emergency. Do not wait to see if the symptoms will go away. Get medical help right away. Call your local emergency services (911 in the [place] Do not drive yourself to the hospital. Summary Hypertension is when the force of blood pumping through your arteries is too strong. If this condition is not controlled, it may put you at risk for serious complications. Your personal target blood pressure may  vary depending on your medical conditions, your age, and other factors. For most people, a normal blood pressure is less than 120/80. Hypertension is managed by lifestyle changes, medicines, or both. Lifestyle changes to help manage hypertension include losing weight, eating a healthy, low-sodium diet, exercising more, stopping smoking, and limiting alcohol. This information is not intended to replace advice given to you by your health care provider. Make sure you discuss any questions you have with your health care provider. Document Revised: 09/02/2019 Document Reviewed: 07/16/2019 Elsevier Patient Education  2022 [employer]   Preventive Care 67-51 Years Old, Female Preventive care refers to lifestyle choices and visits with your health care provider that can promote health and wellness. Preventive care visits are also called wellness exams. What can I expect for my preventive care visit? Counseling Your health care provider may ask you questions about your: Medical history, including: Past medical problems. Family medical history. Pregnancy history. Current health, including: Menstrual cycle. Method of birth control. Emotional well-being. Home life and relationship well-being. Sexual activity and sexual health. Lifestyle, including: Alcohol, nicotine or tobacco, and drug use. Access to firearms. Diet, exercise, and sleep habits. Work and work Statistician. Sunscreen use. Safety issues such as seatbelt and bike helmet use. Physical exam Your health care provider will check your: Height and weight. These may be used to calculate your BMI (body mass index). BMI is a measurement that tells if you are at a healthy weight. Waist circumference. This measures the distance around your waistline. This measurement also tells if you are at a healthy weight and may help predict your risk of certain diseases, such as type 2 diabetes and high blood pressure. Heart rate and blood  pressure. Body temperature. Skin for abnormal spots. What immunizations do I need? Vaccines are usually given at various ages, according to a schedule. Your health care provider will recommend vaccines for you based on your age, medical history, and lifestyle or other factors, such as travel or where you work. What tests do I need? Screening Your health care provider may recommend screening tests for certain conditions. This may include: Lipid and cholesterol levels. Diabetes screening. This is done by checking your blood sugar (glucose) after you have not eaten for a while (fasting). Pelvic exam and Pap test. Hepatitis B test. Hepatitis C test. HIV (human immunodeficiency virus) test. STI (sexually transmitted infection) testing, if you are at risk. Lung cancer screening. Colorectal cancer screening. Mammogram. Talk with your health care provider about when you should start having regular mammograms. This may depend on whether you have a family history of breast cancer. BRCA-related cancer screening. This may be done if you have a family history of breast, ovarian, tubal, or peritoneal cancers. Bone density scan. This is done to screen for osteoporosis. Talk with your health care provider about your test results, treatment options, and if necessary, the need for more tests. Follow these instructions at home: Eating and drinking  Eat a  diet that includes fresh fruits and vegetables, whole grains, lean protein, and low-fat dairy products. Take vitamin and mineral supplements as recommended by your health care provider. Do not drink alcohol if: Your health care provider tells you not to drink. You are pregnant, may be pregnant, or are planning to become pregnant. If you drink alcohol: Limit how much you have to 0-1 drink a day. Know how much alcohol is in your drink. In the [place] one drink equals one 12 oz bottle of beer (355 mL), one 5 oz glass of wine (148 mL), or one 1 oz glass of  hard liquor (44 mL). Lifestyle Brush your teeth every morning and night with fluoride toothpaste. Floss one time each day. Exercise for at least 30 minutes 5 or more days each week. Do not use any products that contain nicotine or tobacco. These products include cigarettes, chewing tobacco, and vaping devices, such as e-cigarettes. If you need help quitting, ask your health care provider. Do not use drugs. If you are sexually active, practice safe sex. Use a condom or other form of protection to prevent STIs. If you do not wish to become pregnant, use a form of birth control. If you plan to become pregnant, see your health care provider for a prepregnancy visit. Take aspirin only as told by your health care provider. Make sure that you understand how much to take and what form to take. Work with your health care provider to find out whether it is safe and beneficial for you to take aspirin daily. Find healthy ways to manage stress, such as: Meditation, yoga, or listening to music. Journaling. Talking to a trusted person. Spending time with friends and family. Minimize exposure to UV radiation to reduce your risk of skin cancer. Safety Always wear your seat belt while driving or riding in a vehicle. Do not drive: If you have been drinking alcohol. Do not ride with someone who has been drinking. When you are tired or distracted. While texting. If you have been using any mind-altering substances or drugs. Wear a helmet and other protective equipment during sports activities. If you have firearms in your house, make sure you follow all gun safety procedures. Seek help if you have been physically or sexually abused. What's next? Visit your health care provider once a year for an annual wellness visit. Ask your health care provider how often you should have your eyes and teeth checked. Stay up to date on all vaccines. This information is not intended to replace advice given to you by your  health care provider. Make sure you discuss any questions you have with your health care provider. Document Revised: 02/10/2021 Document Reviewed: 02/10/2021 Elsevier Patient Education  [place].

## 2021-11-03 NOTE — Progress Notes (Signed)
Subjective:  Patient ID: Rolanda Lundborg, female    DOB: 01-Aug-1971  Age: 51 y.o. MRN: [id number]  CC:  Chief Complaint  Patient presents with   Annual Exam    Pt reports got a monitor from [employer] and reports always reads 140/90 pt is due for tetanus vaccine as well today pt did have a cup of coffee but no food this am     HPI Athalia Tumulty presents for  Annual physical exam, establish care visit last June. No family, health changes. Sisters husband had heart attack - doing ok.   History of hypertension, improved after diet, job changes and exercise.  Sodium avoidance discussed last visit with home monitoring.  Blood pressures around 140/90 at home.  No current meds.  Normal TSH June 2022.has home cuff - reading a little higher on her monitor.  Some shortness of breath at times - last 6 months at rest, or quiet - needs to yawn or need to take a deep breath. No cough.  No dyspnea with exertion, asx with [employer] Do. No PND, question anxiety at times  No chest pains.  No weakness, no HA.  Cut out sodium in doet and adjusting diet considerably.  BP Readings from Last 3 Encounters:  11/03/21 (!) 160/90  02/17/21 130/78  09/28/15 122/82   Hyperlipidemia: Plan for diet/exercise approach with elevated readings in June.  Low ASCVD risk score of 0.9%.  not fasting - cream/sugar.  Lab Results  Component Value Date   CHOL 235 (H) 02/17/2021   HDL 79.20 02/17/2021   LDLCALC 138 (H) 02/17/2021   TRIG 88.0 02/17/2021   CHOLHDL 3 02/17/2021   Lab Results  Component Value Date   ALT 13 02/17/2021   AST 18 02/17/2021   ALKPHOS 53 02/17/2021   BILITOT 0.7 02/17/2021   Depression screen PHQ 2/9 11/03/2021 09/28/2015  Decreased Interest 0 0  Down, Depressed, Hopeless 0 0  PHQ - 2 Score 0 0  Altered sleeping 0 -  Tired, decreased energy 0 -  Change in appetite 0 -  Feeling bad or failure about yourself  0 -  Trouble concentrating 0 -  Moving slowly or fidgety/restless 0 -  Suicidal  thoughts 0 -  PHQ-9 Score 0 -   Cancer screening Colon: negative Cologuard 12/07/20 at GYN.  Pap testing 09/30/2020, GYN Dr. Billy Coast. Appt in May.  Mammogram: Benign left breast cysts on ultrasound and mammogram in April 2021, repeat recommended 1 year. Normal last year at GYN, appt in May.  Immunization History  Administered Date(s) Administered   Influenza,inj,Quad PF,6+ Mos 05/28/2013, 06/11/2014   PFIZER(Purple Top)SARS-COV-2 Vaccination 11/15/2019, 12/06/2019, 10/26/2020   Tdap 07/31/2006  Covid bivalent booster in October.  Tdap today.   Optho - Dr. Jackquline Bosch. appt in May last year, appt in June this year. Vision ok. Glasses to drive, bifocals.   Dentist -recent visit - Dr. Dorothey Baseman. Every 6 months.   Alcohol: Glass of wine few nights per week.  No tobacco.  Exercise: Yoga once per week, Tae Kwon Do 2-3 nts/week, walking with dogs daily.   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    Allergies  Allergen Reactions  Sulfa Antibiotics Rash   Prior to Admission medications   Medication Sig Start Date End Date Taking? Authorizing Provider  XIIDRA 5 % SOLN Apply 1 drop to eye 2 (two) times daily. 01/21/21  Yes [provider]   Social History   Socioeconomic History   Marital status: Married    Spouse name: Not on file   Number of children: Not on file   Years of education: Not on file   Highest education level: Not on file  Occupational History   Not on file  Tobacco Use   Smoking status: Never   Smokeless tobacco: Not on file  Substance and Sexual Activity   Alcohol use: Yes    Alcohol/week: 2.0 - 3.0 standard drinks    Types: 2 - 3 Glasses of wine per week    Comment:  social   Drug use: No   Sexual activity: Yes  Other Topics Concern   Not on file  Social History Narrative   Not on file   Social Determinants of Health   Financial Resource Strain: Not on file  Food Insecurity: Not on file  Transportation Needs: Not on file  Physical Activity: Not on file  Stress: Not on file  Social Connections: Not on file  Intimate Partner Violence: Not on file   Review of Systems Per HPI. 13 point review of systems per patient health survey noted.  Negative other than as indicated above or in HPI.    Objective:   Vitals:   11/03/21 0917 11/03/21 0920 11/03/21 0928  BP: (!) 164/92 (!) 195/115 (!) 160/90  Pulse: (!) 58    Resp: 17    Temp: 97.9 F (36.6 C)    TempSrc: Temporal    SpO2: 99%    Weight: 132 lb 12.8 oz (60.2 kg)    Height: 5\' 4"  (1.626 m)       Physical Exam Constitutional:      Appearance: She is well-developed.  HENT:     Head: Normocephalic and atraumatic.     Right Ear: External ear normal.     Left Ear: External ear normal.  Eyes:     Conjunctiva/sclera: Conjunctivae normal.     Pupils: Pupils are equal, round, and reactive to light.  Neck:     Thyroid: No thyromegaly.  Cardiovascular:     Rate and Rhythm: Normal rate and regular rhythm.     Heart sounds: Normal heart sounds. No murmur heard. Pulmonary:     Effort: Pulmonary effort is normal. No respiratory distress.     Breath sounds: Normal breath sounds. No stridor. No wheezing or rhonchi.  Abdominal:     General: Bowel sounds are normal.     Palpations: Abdomen is soft.     Tenderness: There is no abdominal tenderness.  Musculoskeletal:        General: No tenderness. Normal range of motion.     Cervical back: Normal range of motion and neck supple.  Lymphadenopathy:     Cervical: No cervical adenopathy.  Skin:    General: Skin is warm and dry.     Findings: No rash.  Neurological:     Mental Status: She is alert and oriented to person, place, and time.   Psychiatric:        Behavior: Behavior normal.        Thought Content: Thought content normal.   EKG, sinus rhythm, rate 59.  No apparent acute ST or T wave changes or significant changes from comparison EKG  in August 2013  Assessment & Plan:  Adalind Gulas is a 51 y.o. female . Annual physical exam  - -anticipatory guidance as below in AVS, screening labs above. Health maintenance items as above in HPI discussed/recommended as applicable.   Screening for hyperlipidemia - Plan: Comprehensive metabolic panel, Lipid panel  Screening for diabetes mellitus - Plan: Comprehensive metabolic panel, Hemoglobin A1c  Screening for deficiency anemia - Plan: Comprehensive metabolic panel, CBC with Differential/Platelet  Need for tetanus booster - Plan: Tdap vaccine greater than or equal to 7yo IM  Essential hypertension - Plan: Comprehensive metabolic panel, amLODipine (NORVASC) 5 MG tablet, EKG 12-Lead  -Higher readings as above, start amlodipine.  Potential side effects discussed.  Dyspnea, unspecified type - Plan: EKG 12-Lead Symptoms at rest, with yawning.  Unlikely cardiac, EKG reassuring.  RTC/ER precautions if associated chest discomfort, dyspnea with exertion or new/worsening symptoms.  Meds ordered this encounter  Medications   amLODipine (NORVASC) 5 MG tablet    Sig: Take 1 tablet (5 mg total) by mouth daily.    Dispense:  90 tablet    Refill:  1   Patient Instructions  Start amlodipine once per day for blood pressure.  Watch for lightheadedness or dizziness or too low of readings and if that occurs let me know.  Recheck in 1 month but let me know if there are questions sooner.  Lab visit in the next week if possible.   Occasionally on/shortness of breath at rest is not concerning but if you have any shortness of breath with exertion or other new symptoms with exercise be seen right away.  Thanks for coming in today.  Let me know if there are questions.  Managing Your  Hypertension Hypertension, also called high blood pressure, is when the force of the blood pressing against the walls of the arteries is too strong. Arteries are blood vessels that carry blood from your heart throughout your body. Hypertension forces the heart to work harder to pump blood and may cause the arteries to become narrow or stiff. Understanding blood pressure readings Your personal target blood pressure may vary depending on your medical conditions, your age, and other factors. A blood pressure reading includes a higher number over a lower number. Ideally, your blood pressure should be below 120/80. You should know that: The first, or top, number is called the systolic pressure. It is a measure of the pressure in your arteries as your heart beats. The second, or bottom number, is called the diastolic pressure. It is a measure of the pressure in your arteries as the heart relaxes. Blood pressure is classified into four stages. Based on your blood pressure reading, your health care provider may use the following stages to determine what type of treatment you need, if any. Systolic pressure and diastolic pressure are measured in a unit called mmHg. Normal Systolic pressure: below 120. Diastolic pressure: below 80. Elevated Systolic pressure: 120-129. Diastolic pressure: below 80. Hypertension stage 1 Systolic pressure: 130-139. Diastolic pressure: 80-89. Hypertension stage 2 Systolic pressure: 140 or above. Diastolic pressure: 90 or above. How can this condition affect me? Managing your hypertension is an important responsibility. Over time, hypertension can damage the arteries and decrease blood flow to important parts of the body, including the brain, heart, and kidneys. Having untreated or uncontrolled hypertension can lead to: A heart attack. A stroke. A weakened blood vessel (aneurysm). Heart failure. Kidney damage. Eye damage. Metabolic syndrome. Memory and concentration  problems. Vascular dementia. What actions can I  take to manage this condition? Hypertension can be managed by making lifestyle changes and possibly by taking medicines. Your health care provider will help you make a plan to bring your blood pressure within a normal range. Nutrition  Eat a diet that is high in fiber and potassium, and low in salt (sodium), added sugar, and fat. An example eating plan is called the Dietary Approaches to Stop Hypertension (DASH) diet. To eat this way: Eat plenty of fresh fruits and vegetables. Try to fill one-half of your plate at each meal with fruits and vegetables. Eat whole grains, such as whole-wheat pasta, brown rice, or whole-grain bread. Fill about one-fourth of your plate with whole grains. Eat low-fat dairy products. Avoid fatty cuts of meat, processed or cured meats, and poultry with skin. Fill about one-fourth of your plate with lean proteins such as fish, chicken without skin, beans, eggs, and tofu. Avoid pre-made and processed foods. These tend to be higher in sodium, added sugar, and fat. Reduce your daily sodium intake. Most people with hypertension should eat less than 1,500 mg of sodium a day. Lifestyle  Work with your health care provider to maintain a healthy body weight or to lose weight. Ask what an ideal weight is for you. Get at least 30 minutes of exercise that causes your heart to beat faster (aerobic exercise) most days of the week. Activities may include walking, swimming, or biking. Include exercise to strengthen your muscles (resistance exercise), such as weight lifting, as part of your weekly exercise routine. Try to do these types of exercises for 30 minutes at least 3 days a week. Do not use any products that contain nicotine or tobacco, such as cigarettes, e-cigarettes, and chewing tobacco. If you need help quitting, ask your health care provider. Control any long-term (chronic) conditions you have, such as high cholesterol or  diabetes. Identify your sources of stress and find ways to manage stress. This may include meditation, deep breathing, or making time for fun activities. Alcohol use Do not drink alcohol if: Your health care provider tells you not to drink. You are pregnant, may be pregnant, or are planning to become pregnant. If you drink alcohol: Limit how much you use to: 0-1 drink a day for women. 0-2 drinks a day for men. Be aware of how much alcohol is in your drink. In the [place] one drink equals one 12 oz bottle of beer (355 mL), one 5 oz glass of wine (148 mL), or one 1 oz glass of hard liquor (44 mL). Medicines Your health care provider may prescribe medicine if lifestyle changes are not enough to get your blood pressure under control and if: Your systolic blood pressure is 130 or higher. Your diastolic blood pressure is 80 or higher. Take medicines only as told by your health care provider. Follow the directions carefully. Blood pressure medicines must be taken as told by your health care provider. The medicine does not work as well when you skip doses. Skipping doses also puts you at risk for problems. Monitoring Before you monitor your blood pressure: Do not smoke, drink caffeinated beverages, or exercise within 30 minutes before taking a measurement. Use the bathroom and empty your bladder (urinate). Sit quietly for at least 5 minutes before taking measurements. Monitor your blood pressure at home as told by your health care provider. To do this: Sit with your back straight and supported. Place your feet flat on the floor. Do not cross your legs. Support your arm on a  flat surface, such as a table. Make sure your upper arm is at heart level. Each time you measure, take two or three readings one minute apart and record the results. You may also need to have your blood pressure checked regularly by your health care provider. General information Talk with your health care provider about your  diet, exercise habits, and other lifestyle factors that may be contributing to hypertension. Review all the medicines you take with your health care provider because there may be side effects or interactions. Keep all visits as told by your health care provider. Your health care provider can help you create and adjust your plan for managing your high blood pressure. Where to find more information National Heart, Lung, and Blood Institute: PopSte[employer].is American Heart Association: www.heart.org Contact a health care provider if: You think you are having a reaction to medicines you have taken. You have repeated (recurrent) headaches. You feel dizzy. You have swelling in your ankles. You have trouble with your vision. Get help right away if: You develop a severe headache or confusion. You have unusual weakness or numbness, or you feel faint. You have severe pain in your chest or abdomen. You vomit repeatedly. You have trouble breathing. These symptoms may represent a serious problem that is an emergency. Do not wait to see if the symptoms will go away. Get medical help right away. Call your local emergency services (911 in the [place] Do not drive yourself to the hospital. Summary Hypertension is when the force of blood pumping through your arteries is too strong. If this condition is not controlled, it may put you at risk for serious complications. Your personal target blood pressure may vary depending on your medical conditions, your age, and other factors. For most people, a normal blood pressure is less than 120/80. Hypertension is managed by lifestyle changes, medicines, or both. Lifestyle changes to help manage hypertension include losing weight, eating a healthy, low-sodium diet, exercising more, stopping smoking, and limiting alcohol. This information is not intended to replace advice given to you by your health care provider. Make sure you discuss any questions you have with your  health care provider. Document Revised: 09/02/2019 Document Reviewed: 07/16/2019 Elsevier Patient Education  2022 [employer].   Preventive Care 57-37 Years Old, Female Preventive care refers to lifestyle choices and visits with your health care provider that can promote health and wellness. Preventive care visits are also called wellness exams. What can I expect for my preventive care visit? Counseling Your health care provider may ask you questions about your: Medical history, including: Past medical problems. Family medical history. Pregnancy history. Current health, including: Menstrual cycle. Method of birth control. Emotional well-being. Home life and relationship well-being. Sexual activity and sexual health. Lifestyle, including: Alcohol, nicotine or tobacco, and drug use. Access to firearms. Diet, exercise, and sleep habits. Work and work Astronomer. Sunscreen use. Safety issues such as seatbelt and bike helmet use. Physical exam Your health care provider will check your: Height and weight. These may be used to calculate your BMI (body mass index). BMI is a measurement that tells if you are at a healthy weight. Waist circumference. This measures the distance around your waistline. This measurement also tells if you are at a healthy weight and may help predict your risk of certain diseases, such as type 2 diabetes and high blood pressure. Heart rate and blood pressure. Body temperature. Skin for abnormal spots. What immunizations do I need? Vaccines are usually given at various ages,  according to a schedule. Your health care provider will recommend vaccines for you based on your age, medical history, and lifestyle or other factors, such as travel or where you work. What tests do I need? Screening Your health care provider may recommend screening tests for certain conditions. This may include: Lipid and cholesterol levels. Diabetes screening. This is done by checking  your blood sugar (glucose) after you have not eaten for a while (fasting). Pelvic exam and Pap test. Hepatitis B test. Hepatitis C test. HIV (human immunodeficiency virus) test. STI (sexually transmitted infection) testing, if you are at risk. Lung cancer screening. Colorectal cancer screening. Mammogram. Talk with your health care provider about when you should start having regular mammograms. This may depend on whether you have a family history of breast cancer. BRCA-related cancer screening. This may be done if you have a family history of breast, ovarian, tubal, or peritoneal cancers. Bone density scan. This is done to screen for osteoporosis. Talk with your health care provider about your test results, treatment options, and if necessary, the need for more tests. Follow these instructions at home: Eating and drinking  Eat a diet that includes fresh fruits and vegetables, whole grains, lean protein, and low-fat dairy products. Take vitamin and mineral supplements as recommended by your health care provider. Do not drink alcohol if: Your health care provider tells you not to drink. You are pregnant, may be pregnant, or are planning to become pregnant. If you drink alcohol: Limit how much you have to 0-1 drink a day. Know how much alcohol is in your drink. In the [place] one drink equals one 12 oz bottle of beer (355 mL), one 5 oz glass of wine (148 mL), or one 1 oz glass of hard liquor (44 mL). Lifestyle Brush your teeth every morning and night with fluoride toothpaste. Floss one time each day. Exercise for at least 30 minutes 5 or more days each week. Do not use any products that contain nicotine or tobacco. These products include cigarettes, chewing tobacco, and vaping devices, such as e-cigarettes. If you need help quitting, ask your health care provider. Do not use drugs. If you are sexually active, practice safe sex. Use a condom or other form of protection to prevent STIs. If you  do not wish to become pregnant, use a form of birth control. If you plan to become pregnant, see your health care provider for a prepregnancy visit. Take aspirin only as told by your health care provider. Make sure that you understand how much to take and what form to take. Work with your health care provider to find out whether it is safe and beneficial for you to take aspirin daily. Find healthy ways to manage stress, such as: Meditation, yoga, or listening to music. Journaling. Talking to a trusted person. Spending time with friends and family. Minimize exposure to UV radiation to reduce your risk of skin cancer. Safety Always wear your seat belt while driving or riding in a vehicle. Do not drive: If you have been drinking alcohol. Do not ride with someone who has been drinking. When you are tired or distracted. While texting. If you have been using any mind-altering substances or drugs. Wear a helmet and other protective equipment during sports activities. If you have firearms in your house, make sure you follow all gun safety procedures. Seek help if you have been physically or sexually abused. What's next? Visit your health care provider once a year for an annual wellness visit. Ask  your health care provider how often you should have your eyes and teeth checked. Stay up to date on all vaccines. This information is not intended to replace advice given to you by your health care provider. Make sure you discuss any questions you have with your health care provider. Document Revised: 02/10/2021 Document Reviewed: 02/10/2021 Elsevier Patient Education  2022 Elsevier Inc.     Signed,   Meredith Staggers, MD [facility], [facility] 11/03/21 10:33 AM

## 2021-11-09 ENCOUNTER — Other Ambulatory Visit (INDEPENDENT_AMBULATORY_CARE_PROVIDER_SITE_OTHER): Payer: BC Managed Care – PPO

## 2021-11-09 DIAGNOSIS — I10 Essential (primary) hypertension: Secondary | ICD-10-CM

## 2021-11-09 DIAGNOSIS — Z130 Encounter for screening for diseases of the blood and blood-forming organs and certain disorders involving the immune mechanism: Secondary | ICD-10-CM

## 2021-11-09 DIAGNOSIS — Z13220 Encounter for screening for lipoid disorders: Secondary | ICD-10-CM

## 2021-11-09 DIAGNOSIS — Z131 Encounter for screening for diabetes mellitus: Secondary | ICD-10-CM | POA: Diagnosis not present

## 2021-11-09 LAB — COMPREHENSIVE METABOLIC PANEL
ALT: 13 U/L (ref 0–35)
AST: 15 U/L (ref 0–37)
Albumin: 4.5 g/dL (ref 3.5–5.2)
Alkaline Phosphatase: 44 U/L (ref 39–117)
BUN: 13 mg/dL (ref 6–23)
CO2: 26 mEq/L (ref 19–32)
Calcium: 9.3 mg/dL (ref 8.4–10.5)
Chloride: 103 mEq/L (ref 96–112)
Creatinine, Ser: 0.84 mg/dL (ref 0.40–1.20)
GFR: 81.32 mL/min (ref >60.00)
Glucose, Bld: 88 mg/dL (ref 70–99)
Potassium: 3.7 mEq/L (ref 3.5–5.1)
Sodium: 137 mEq/L (ref 135–145)
Total Bilirubin: 0.7 mg/dL (ref 0.2–1.2)
Total Protein: 6.4 g/dL (ref 6.0–8.3)

## 2021-11-09 LAB — LIPID PANEL
Cholesterol: 197 mg/dL (ref 0–200)
HDL: 59 mg/dL (ref >39.00)
LDL Cholesterol: 121 mg/dL — ABNORMAL HIGH (ref 0–99)
NonHDL: 138.01
Total CHOL/HDL Ratio: 3
Triglycerides: 87 mg/dL (ref 0.0–149.0)
VLDL: 17.4 mg/dL (ref 0.0–40.0)

## 2021-11-09 LAB — CBC WITH DIFFERENTIAL/PLATELET
Basophils Absolute: 0 10*3/uL (ref 0.0–0.1)
Basophils Relative: 0.8 % (ref 0.0–3.0)
Eosinophils Absolute: 0.1 10*3/uL (ref 0.0–0.7)
Eosinophils Relative: 2.7 % (ref 0.0–5.0)
HCT: 39.8 % (ref 36.0–46.0)
Hemoglobin: 13.2 g/dL (ref 12.0–15.0)
Lymphocytes Relative: 36.1 % (ref 12.0–46.0)
Lymphs Abs: 1.7 10*3/uL (ref 0.7–4.0)
MCHC: 33.1 g/dL (ref 30.0–36.0)
MCV: 91.2 fl (ref 78.0–100.0)
Monocytes Absolute: 0.3 10*3/uL (ref 0.1–1.0)
Monocytes Relative: 7.1 % (ref 3.0–12.0)
Neutro Abs: 2.5 10*3/uL (ref 1.4–7.7)
Neutrophils Relative %: 53.3 % (ref 43.0–77.0)
Platelets: 257 10*3/uL (ref 150.0–400.0)
RBC: 4.36 Mil/uL (ref 3.87–5.11)
RDW: 13.3 % (ref 11.5–15.5)
WBC: 4.8 10*3/uL (ref 4.0–10.5)

## 2021-11-09 LAB — HEMOGLOBIN A1C: Hgb A1c MFr Bld: 5.5 % (ref 4.6–6.5)

## 2021-11-11 ENCOUNTER — Encounter: Payer: Self-pay | Admitting: Family Medicine

## 2021-11-23 ENCOUNTER — Encounter: Payer: Self-pay | Admitting: Family Medicine

## 2021-12-06 ENCOUNTER — Ambulatory Visit: Payer: BC Managed Care – PPO | Admitting: Family Medicine

## 2021-12-08 ENCOUNTER — Ambulatory Visit (INDEPENDENT_AMBULATORY_CARE_PROVIDER_SITE_OTHER): Payer: BC Managed Care – PPO | Admitting: Family Medicine

## 2021-12-08 VITALS — BP 132/70 | HR 60 | Temp 98.1°F | Resp 15 | Ht 64.0 in | Wt 128.4 lb

## 2021-12-08 DIAGNOSIS — I10 Essential (primary) hypertension: Secondary | ICD-10-CM

## 2021-12-08 MED ORDER — AMLODIPINE BESYLATE 5 MG PO TABS: 5.0000 mg | ORAL_TABLET | Freq: Every day | ORAL | 3 refills | Status: DC

## 2021-12-08 NOTE — Patient Instructions (Addendum)
Keep a record of your blood pressures outside of the office and if elevated, can arrange nurse visit to check accuracy of your meter. If stable, recheck in March for a physical.  ? ?Return to the clinic or go to the nearest emergency room if any of your symptoms worsen or new symptoms occur. ? ?

## 2021-12-08 NOTE — Progress Notes (Signed)
? ?Subjective:  ?Patient ID: Brittany Howard, female    DOB: 13-Feb-1971  Age: 51 y.o. MRN: [id number] ? ?CC:  ?Chief Complaint  ?Patient presents with  ? Hypertension  ?  Pt here for follow up on BP, denies physical sxs since starting medication   ? ? ?HPI ?Brittany Howard presents for  ? ?Hypertension: ?Discussed at her physical on March 8.  History of hypertension that had improved after diet, job changes and exercise readings had started to increase.  Started on amlodipine 5 mg last visit.  At that time she did note a feeling of shortness of breath when she was at rest or in the quiet worsening in the ER or take a deep breath but no cough, no dyspnea on exertion, asymptomatic with taekwondo. ?No new side effects. Taking amlodipine nightly.  ?Home readings: 140/? ?Appt with GYN in may - will check then.  ?Still feeling of needing to take deep breath at times, no DOE, no CP. No cough.  ?BP Readings from Last 3 Encounters:  ?12/08/21 132/70  ?11/03/21 (!) 160/90  ?02/17/21 130/78  ? ?Lab Results  ?Component Value Date  ? CREATININE 0.84 11/09/2021  ? ? ?History ?Patient Active Problem List  ? Diagnosis Date Noted  ? 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 ?Past Medical History:  ?Diagnosis Date  ? Allergy   ? Gluten intolerance   ? Hyperlipidemia   ? ?Past Surgical History:  ?Procedure Laterality Date  ? TUBAL LIGATION    ? 1 tube removed due to ectopic pregnancy in 2004.   ? ?Allergies  ?Allergen Reactions  ? Sulfa Antibiotics Rash  ? ?Prior to Admission medications   ?Medication Sig Start Date End Date Taking? Authorizing Provider  ?amLODipine (NORVASC) 5 MG tablet Take 1 tablet (5 mg total) by mouth daily. 11/03/21  Yes Wendie Agreste, MD  ?Shirley Friar 5 % SOLN Apply 1 drop to eye 2 (two) times daily. 01/21/21  Yes [provider]  ? ?Social History  ? ?Socioeconomic History  ? Marital status: Married  ?  Spouse name: Not on file  ? Number of children: Not on file  ? Years of education: Not on file  ? Highest  education level: Not on file  ?Occupational History  ? Not on file  ?Tobacco Use  ? Smoking status: Never  ? Smokeless tobacco: Not on file  ?Substance and Sexual Activity  ? Alcohol use: Yes  ?  Alcohol/week: 2.0 - 3.0 standard drinks  ?  Types: 2 - 3 Glasses of wine per week  ?  Comment: social  ? Drug use: No  ? Sexual activity: Yes  ?Other Topics Concern  ? Not on file  ?Social History Narrative  ? Not on file  ? ?Social Determinants of Health  ? ?Financial Resource Strain: Not on file  ?Food Insecurity: Not on file  ?Transportation Needs: Not on file  ?Physical Activity: Not on file  ?Stress: Not on file  ?Social Connections: Not on file  ?Intimate Partner Violence: Not on file  ? ? ?Review of Systems  ?Constitutional:  Negative for fatigue and unexpected weight change.  ?Respiratory:  Negative for chest tightness and shortness of breath (feeling of need to yawn only.).   ?Cardiovascular:  Negative for chest pain, palpitations and leg swelling.  ?Gastrointestinal:  Negative for abdominal pain and blood in stool.  ?Neurological:  Negative for dizziness, syncope, light-headedness and headaches.  ? ? ?Objective:  ? ?Vitals:  ? 12/08/21 0832  ?BP: 132/70  ?  Pulse: 60  ?Resp: 15  ?Temp: 98.1 ?F (36.7 ?C)  ?TempSrc: Temporal  ?SpO2: 96%  ?Weight: 128 lb 6.4 oz (58.2 kg)  ?Height: 5' 4"  (1.626 m)  ? ? ? ?Physical Exam ?Vitals reviewed.  ?Constitutional:   ?   Appearance: Normal appearance. She is well-developed.  ?HENT:  ?   Head: Normocephalic and atraumatic.  ?Eyes:  ?   Conjunctiva/sclera: Conjunctivae normal.  ?   Pupils: Pupils are equal, round, and reactive to light.  ?Neck:  ?   Vascular: No carotid bruit.  ?Cardiovascular:  ?   Rate and Rhythm: Normal rate and regular rhythm.  ?   Heart sounds: Normal heart sounds.  ?Pulmonary:  ?   Effort: Pulmonary effort is normal.  ?   Breath sounds: Normal breath sounds.  ?Abdominal:  ?   Palpations: Abdomen is soft. There is no pulsatile mass.  ?   Tenderness: There is no  abdominal tenderness.  ?Musculoskeletal:  ?   Right lower leg: No edema.  ?   Left lower leg: No edema.  ?Skin: ?   General: Skin is warm and dry.  ?Neurological:  ?   Mental Status: She is alert and oriented to person, place, and time.  ?Psychiatric:     ?   Mood and Affect: Mood normal.     ?   Behavior: Behavior normal.  ? ? ? ?Assessment & Plan:  ?Brittany Howard is a 51 y.o. female . ?Essential hypertension - Plan: amLODipine (NORVASC) 5 MG tablet ? - improved. Continue 86m amlodipine. Rtc precautions if continued need to yawn, but no DOE, cough or chest pain. Declined further w/u at this time. Option of nurse visit to check meter if elevated home readings.  ?CPE in 11 months ?Meds ordered this encounter  ?Medications  ? amLODipine (NORVASC) 5 MG tablet  ?  Sig: Take 1 tablet (5 mg total) by mouth daily.  ?  Dispense:  90 tablet  ?  Refill:  3  ? ?Patient Instructions  ?Keep a record of your blood pressures outside of the office and if elevated, can arrange nurse visit to check accuracy of your meter. If stable, recheck in March for a physical.  ? ?Return to the clinic or go to the nearest emergency room if any of your symptoms worsen or new symptoms occur. ? ? ? ? ?Signed,  ? ?JMerri Ray MD ?[facility] ?12/08/21 ?8:51 AM ? ? ?

## 2022-01-04 DIAGNOSIS — Z01411 Encounter for gynecological examination (general) (routine) with abnormal findings: Secondary | ICD-10-CM | POA: Diagnosis not present

## 2022-01-04 DIAGNOSIS — Z0142 Encounter for cervical smear to confirm findings of recent normal smear following initial abnormal smear: Secondary | ICD-10-CM | POA: Diagnosis not present

## 2022-01-04 DIAGNOSIS — Z6821 Body mass index (BMI) 21.0-21.9, adult: Secondary | ICD-10-CM | POA: Diagnosis not present

## 2022-01-04 DIAGNOSIS — Z01419 Encounter for gynecological examination (general) (routine) without abnormal findings: Secondary | ICD-10-CM | POA: Diagnosis not present

## 2022-01-04 DIAGNOSIS — Z124 Encounter for screening for malignant neoplasm of cervix: Secondary | ICD-10-CM | POA: Diagnosis not present

## 2022-01-04 DIAGNOSIS — Z1231 Encounter for screening mammogram for malignant neoplasm of breast: Secondary | ICD-10-CM | POA: Diagnosis not present

## 2022-01-04 LAB — HM MAMMOGRAPHY

## 2022-01-05 ENCOUNTER — Encounter: Payer: Self-pay | Admitting: Family Medicine

## 2022-04-14 ENCOUNTER — Other Ambulatory Visit: Payer: Self-pay

## 2022-04-14 ENCOUNTER — Other Ambulatory Visit: Payer: Self-pay | Admitting: Family Medicine

## 2022-04-14 DIAGNOSIS — I10 Essential (primary) hypertension: Secondary | ICD-10-CM

## 2022-04-14 MED ORDER — AMLODIPINE BESYLATE 5 MG PO TABS: 5.0000 mg | ORAL_TABLET | Freq: Every day | ORAL | 3 refills | Status: DC

## 2022-06-14 DIAGNOSIS — H524 Presbyopia: Secondary | ICD-10-CM | POA: Diagnosis not present

## 2022-08-18 DIAGNOSIS — D225 Melanocytic nevi of trunk: Secondary | ICD-10-CM | POA: Diagnosis not present

## 2022-08-30 ENCOUNTER — Encounter: Payer: Self-pay | Admitting: Family Medicine

## 2022-11-09 ENCOUNTER — Encounter: Payer: Self-pay | Admitting: Family Medicine

## 2022-11-09 ENCOUNTER — Ambulatory Visit (INDEPENDENT_AMBULATORY_CARE_PROVIDER_SITE_OTHER): Payer: BC Managed Care – PPO | Admitting: Family Medicine

## 2022-11-09 VITALS — BP 128/74 | HR 74 | Temp 97.6°F | Ht 64.0 in | Wt 134.8 lb

## 2022-11-09 DIAGNOSIS — Z13220 Encounter for screening for lipoid disorders: Secondary | ICD-10-CM

## 2022-11-09 DIAGNOSIS — Z131 Encounter for screening for diabetes mellitus: Secondary | ICD-10-CM | POA: Diagnosis not present

## 2022-11-09 DIAGNOSIS — Z130 Encounter for screening for diseases of the blood and blood-forming organs and certain disorders involving the immune mechanism: Secondary | ICD-10-CM | POA: Diagnosis not present

## 2022-11-09 DIAGNOSIS — I10 Essential (primary) hypertension: Secondary | ICD-10-CM | POA: Diagnosis not present

## 2022-11-09 DIAGNOSIS — Z0000 Encounter for general adult medical examination without abnormal findings: Secondary | ICD-10-CM

## 2022-11-09 LAB — CBC WITH DIFFERENTIAL/PLATELET
Basophils Absolute: 0.1 10*3/uL (ref 0.0–0.1)
Basophils Relative: 1.1 % (ref 0.0–3.0)
Eosinophils Absolute: 0.2 10*3/uL (ref 0.0–0.7)
Eosinophils Relative: 2.7 % (ref 0.0–5.0)
HCT: 42.6 % (ref 36.0–46.0)
Hemoglobin: 13.9 g/dL (ref 12.0–15.0)
Lymphocytes Relative: 30.2 % (ref 12.0–46.0)
Lymphs Abs: 2 10*3/uL (ref 0.7–4.0)
MCHC: 32.5 g/dL (ref 30.0–36.0)
MCV: 92 fl (ref 78.0–100.0)
Monocytes Absolute: 0.4 10*3/uL (ref 0.1–1.0)
Monocytes Relative: 6.4 % (ref 3.0–12.0)
Neutro Abs: 3.9 10*3/uL (ref 1.4–7.7)
Neutrophils Relative %: 59.6 % (ref 43.0–77.0)
Platelets: 266 10*3/uL (ref 150.0–400.0)
RBC: 4.63 Mil/uL (ref 3.87–5.11)
RDW: 13.3 % (ref 11.5–15.5)
WBC: 6.5 10*3/uL (ref 4.0–10.5)

## 2022-11-09 LAB — COMPREHENSIVE METABOLIC PANEL
ALT: 13 U/L (ref 0–35)
AST: 18 U/L (ref 0–37)
Albumin: 4.5 g/dL (ref 3.5–5.2)
Alkaline Phosphatase: 48 U/L (ref 39–117)
BUN: 12 mg/dL (ref 6–23)
CO2: 29 mEq/L (ref 19–32)
Calcium: 9.4 mg/dL (ref 8.4–10.5)
Chloride: 103 mEq/L (ref 96–112)
Creatinine, Ser: 0.85 mg/dL (ref 0.40–1.20)
GFR: 79.61 mL/min (ref >60.00)
Glucose, Bld: 84 mg/dL (ref 70–99)
Potassium: 4 mEq/L (ref 3.5–5.1)
Sodium: 139 mEq/L (ref 135–145)
Total Bilirubin: 0.7 mg/dL (ref 0.2–1.2)
Total Protein: 7.1 g/dL (ref 6.0–8.3)

## 2022-11-09 LAB — LIPID PANEL
Cholesterol: 212 mg/dL — ABNORMAL HIGH (ref 0–200)
HDL: 73.6 mg/dL (ref >39.00)
LDL Cholesterol: 117 mg/dL — ABNORMAL HIGH (ref 0–99)
NonHDL: 138.7
Total CHOL/HDL Ratio: 3
Triglycerides: 110 mg/dL (ref 0.0–149.0)
VLDL: 22 mg/dL (ref 0.0–40.0)

## 2022-11-09 LAB — HEMOGLOBIN A1C: Hgb A1c MFr Bld: 5.4 % (ref 4.6–6.5)

## 2022-11-09 MED ORDER — AMLODIPINE BESYLATE 5 MG PO TABS: 5.0000 mg | ORAL_TABLET | Freq: Every day | ORAL | 3 refills | Status: DC

## 2022-11-09 NOTE — Progress Notes (Signed)
Subjective:  Patient ID: Brittany Howard, female    DOB: [birth year]  Age: 52 y.o. MRN: [id number]  CC:  Chief Complaint  Patient presents with   Annual Exam    Pt is fasting     HPI Brittany Howard presents for Annual Exam  Hypertension: Amlodipine 5 mg daily, no new side effects.  Home readings: 130/76.  BP Readings from Last 3 Encounters:  11/09/22 128/74  12/08/21 132/70  11/03/21 (!) 160/90   Lab Results  Component Value Date   CREATININE 0.84 11/09/2021       11/09/2022    8:02 AM 12/08/2021    8:34 AM 11/03/2021    9:22 AM 09/28/2015    8:51 AM  Depression screen PHQ 2/9  Decreased Interest 0 0 0 0  Down, Depressed, Hopeless 0 0 0 0  PHQ - 2 Score 0 0 0 0  Altered sleeping 0  0   Tired, decreased energy 0  0   Change in appetite 0  0   Feeling bad or failure about yourself  0  0   Trouble concentrating 0  0   Moving slowly or fidgety/restless 0  0   Suicidal thoughts 0  0   PHQ-9 Score 0  0     Health Maintenance  Topic Date Due   Zoster Vaccines- Shingrix (1 of 2) Never done   COVID-19 Vaccine (6 - 2023-24 season) 11/25/2022 (Originally 10/14/2022)   PAP SMEAR-Modifier  10/01/2023   COLONOSCOPY (Pts 45-10yr Insurance coverage will need to be confirmed)  12/08/2023   MAMMOGRAM  01/05/2024   DTaP/Tdap/Td (3 - Td or Tdap) 11/04/2031   INFLUENZA VACCINE  Completed   Hepatitis C Screening  Completed   HIV Screening  Completed   HPV VACCINES  Aged Out  Cologuard negative 12/07/2020 Mammogram 01/05/2022 Gynecology, Dr. TRonita Hipps  Pap 09/30/2020 noted, has yearly pap - appt in May.   Immunization History  Administered Date(s) Administered   Influenza,inj,Quad PF,6+ Mos 05/28/2013, 06/11/2014   Influenza-Unspecified 06/18/2021, 08/19/2022   PFIZER(Purple Top)SARS-COV-2 Vaccination 11/15/2019, 12/06/2019, 10/26/2020, 08/19/2022   Pfizer Covid-19 Vaccine Bivalent Booster 131yr& up 06/18/2021   Tdap 07/31/2006, 11/03/2021  Flu and COVID booster  08/19/2022. Shingrix: plans on Friday before quiet weekend. Plans at pharmacy.   No results found. Optho exam in 05/2022 - Dr. AmHoyle Sauerstable.   Dental:Within Last 6 months, appt last week. Doing well.   Alcohol: 6-8 per week.   Tobacco: none.   Exercise: TaCory Rougheno 2-4 days per week - [id number], yoga 1 hr. on w/e and walking dog. Over [id number].    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    Allergies  Allergen Reactions   Gluten Meal    Sulfa Antibiotics Rash   Prior to Admission medications   Medication Sig Start Date End Date Taking? Authorizing Provider  amLODipine (NORVASC) 5 MG tablet Take 1 tablet (5 mg total) by mouth daily. 04/14/22   GreeWendie Agreste  XIIDRA 5 % SOLN Apply 1 drop to eye 2 (two) times daily. 01/21/21   [provider]   Social History   Socioeconomic History   Marital status:  Married    Spouse name: Not on file   Number of children: Not on file   Years of education: Not on file   Highest education level: Not on file  Occupational History   Not on file  Tobacco Use   Smoking status: Never   Smokeless tobacco: Not on file  Substance and Sexual Activity   Alcohol use: Yes    Alcohol/week: 2.0 - 3.0 standard drinks of alcohol    Types: 2 - 3 Glasses of wine per week    Comment: social   Drug use: No   Sexual activity: Yes  Other Topics Concern   Not on file  Social History Narrative   Not on file   Social Determinants of Health   Financial Resource Strain: Not on file  Food Insecurity: Not on file  Transportation Needs: Not on file  Physical Activity: Not on file  Stress: Not on file  Social Connections: Not on file  Intimate Partner  Violence: Not on file    Review of Systems  13 point review of systems per patient health survey noted.  Negative other than as indicated above or in HPI.   Objective:   Vitals:   11/09/22 0807  BP: 128/74  Pulse: 74  Temp: 97.6 F (36.4 C)  TempSrc: Temporal  SpO2: 96%  Weight: 134 lb 12.8 oz (61.1 kg)  Height: '5\' 4"'$  (1.626 m)     Physical Exam Constitutional:      Appearance: She is well-developed.  HENT:     Head: Normocephalic and atraumatic.     Right Ear: External ear normal.     Left Ear: External ear normal.  Eyes:     Conjunctiva/sclera: Conjunctivae normal.     Pupils: Pupils are equal, round, and reactive to light.  Neck:     Thyroid: No thyromegaly.  Cardiovascular:     Rate and Rhythm: Normal rate and regular rhythm.     Heart sounds: Normal heart sounds. No murmur heard. Pulmonary:     Effort: Pulmonary effort is normal. No respiratory distress.     Breath sounds: Normal breath sounds. No wheezing.  Abdominal:     General: Bowel sounds are normal.     Palpations: Abdomen is soft.     Tenderness: There is no abdominal tenderness.  Musculoskeletal:        General: No tenderness. Normal range of motion.     Cervical back: Normal range of motion and neck supple.  Lymphadenopathy:     Cervical: No cervical adenopathy.  Skin:    General: Skin is warm and dry.     Findings: No rash.  Neurological:     Mental Status: She is alert and oriented to person, place, and time.  Psychiatric:        Behavior: Behavior normal.        Thought Content: Thought content normal.        Assessment & Plan:  Brittany Howard is a 52 y.o. female . Annual physical exam - Plan: CBC with Differential/Platelet, Comprehensive metabolic panel, Lipid panel, Hemoglobin A1c  Screening for diabetes mellitus - Plan: Comprehensive metabolic panel, Hemoglobin A1c  Screening for hyperlipidemia - Plan: Comprehensive metabolic panel, Lipid panel  Screening for deficiency anemia  - Plan: CBC with Differential/Platelet  Essential hypertension - Plan: Comprehensive metabolic panel, Lipid panel, amLODipine (NORVASC) 5 MG tablet  -anticipatory guidance as below in AVS, screening labs above. Health maintenance items as above in HPI discussed/recommended as applicable.  Hypertension  controlled, continue amlodipine same dose, check labs above. Travel in May, handout given with CDC link, option of follow-up appointment for immunization/travel counseling.  Health department another option. Plans on Shingrix vaccine at pharmacy. Recheck 6 months.  No orders of the defined types were placed in this encounter.  Patient Instructions  Thank you for coming in today.  If any concerns on labs I will let you know but no medication changes for now.  2-monthfollow-up for blood pressure but happy to see you sooner if needed.  Take care!  Preventive Care [id number] Old, Female Preventive care refers to lifestyle choices and visits with your health care provider that can promote health and wellness. Preventive care visits are also called wellness exams. What can I expect for my preventive care visit? Counseling Your health care provider may ask you questions about your: Medical history, including: Past medical problems. Family medical history. Pregnancy history. Current health, including: Menstrual cycle. Method of birth control. Emotional well-being. Home life and relationship well-being. Sexual activity and sexual health. Lifestyle, including: Alcohol, nicotine or tobacco, and drug use. Access to firearms. Diet, exercise, and sleep habits. Work and work eStatistician Sunscreen use. Safety issues such as seatbelt and bike helmet use. Physical exam Your health care provider will check your: Height and weight. These may be used to calculate your BMI (body mass index). BMI is a measurement that tells if you are at a healthy weight. Waist circumference. This measures the  distance around your waistline. This measurement also tells if you are at a healthy weight and may help predict your risk of certain diseases, such as type 2 diabetes and high blood pressure. Heart rate and blood pressure. Body temperature. Skin for abnormal spots. What immunizations do I need?  Vaccines are usually given at various ages, according to a schedule. Your health care provider will recommend vaccines for you based on your age, medical history, and lifestyle or other factors, such as travel or where you work. What tests do I need? Screening Your health care provider may recommend screening tests for certain conditions. This may include: Lipid and cholesterol levels. Diabetes screening. This is done by checking your blood sugar (glucose) after you have not eaten for a while (fasting). Pelvic exam and Pap test. Hepatitis B test. Hepatitis C test. HIV (human immunodeficiency virus) test. STI (sexually transmitted infection) testing, if you are at risk. Lung cancer screening. Colorectal cancer screening. Mammogram. Talk with your health care provider about when you should start having regular mammograms. This may depend on whether you have a family history of breast cancer. BRCA-related cancer screening. This may be done if you have a family history of breast, ovarian, tubal, or peritoneal cancers. Bone density scan. This is done to screen for osteoporosis. Talk with your health care provider about your test results, treatment options, and if necessary, the need for more tests. Follow these instructions at home: Eating and drinking  Eat a diet that includes fresh fruits and vegetables, whole grains, lean protein, and low-fat dairy products. Take vitamin and mineral supplements as recommended by your health care provider. Do not drink alcohol if: Your health care provider tells you not to drink. You are pregnant, may be pregnant, or are planning to become pregnant. If you drink  alcohol: Limit how much you have to 0-1 drink a day. Know how much alcohol is in your drink. In the [place] one drink equals one 12 oz bottle of beer (355 mL), one 5 oz glass  of wine (148 mL), or one 1 oz glass of hard liquor (44 mL). Lifestyle Brush your teeth every morning and night with fluoride toothpaste. Floss one time each day. Exercise for at least 30 minutes 5 or more days each week. Do not use any products that contain nicotine or tobacco. These products include cigarettes, chewing tobacco, and vaping devices, such as e-cigarettes. If you need help quitting, ask your health care provider. Do not use drugs. If you are sexually active, practice safe sex. Use a condom or other form of protection to prevent STIs. If you do not wish to become pregnant, use a form of birth control. If you plan to become pregnant, see your health care provider for a prepregnancy visit. Take aspirin only as told by your health care provider. Make sure that you understand how much to take and what form to take. Work with your health care provider to find out whether it is safe and beneficial for you to take aspirin daily. Find healthy ways to manage stress, such as: Meditation, yoga, or listening to music. Journaling. Talking to a trusted person. Spending time with friends and family. Minimize exposure to UV radiation to reduce your risk of skin cancer. Safety Always wear your seat belt while driving or riding in a vehicle. Do not drive: If you have been drinking alcohol. Do not ride with someone who has been drinking. When you are tired or distracted. While texting. If you have been using any mind-altering substances or drugs. Wear a helmet and other protective equipment during sports activities. If you have firearms in your house, make sure you follow all gun safety procedures. Seek help if you have been physically or sexually abused. What's next? Visit your health care provider once a year for an  annual wellness visit. Ask your health care provider how often you should have your eyes and teeth checked. Stay up to date on all vaccines. This information is not intended to replace advice given to you by your health care provider. Make sure you discuss any questions you have with your health care provider. Document Revised: 02/10/2021 Document Reviewed: 02/10/2021 Elsevier Patient Education  [place],   Merri Ray, MD [place] 11/09/22 8:14 AM

## 2022-11-09 NOTE — Patient Instructions (Addendum)
Thank you for coming in today.  If any concerns on labs I will let you know but no medication changes for now.  12-monthfollow-up for blood pressure but happy to see you sooner if needed.  Take care!  Here is a link for travel immunizations and recommendations. Please schedule a visit to review those if needed or the health department is another option.   [email or web address]   Preventive Care 457672Years Old, Female Preventive care refers to lifestyle choices and visits with your health care provider that can promote health and wellness. Preventive care visits are also called wellness exams. What can I expect for my preventive care visit? Counseling Your health care provider may ask you questions about your: Medical history, including: Past medical problems. Family medical history. Pregnancy history. Current health, including: Menstrual cycle. Method of birth control. Emotional well-being. Home life and relationship well-being. Sexual activity and sexual health. Lifestyle, including: Alcohol, nicotine or tobacco, and drug use. Access to firearms. Diet, exercise, and sleep habits. Work and work eStatistician Sunscreen use. Safety issues such as seatbelt and bike helmet use. Physical exam Your health care provider will check your: Height and weight. These may be used to calculate your BMI (body mass index). BMI is a measurement that tells if you are at a healthy weight. Waist circumference. This measures the distance around your waistline. This measurement also tells if you are at a healthy weight and may help predict your risk of certain diseases, such as type 2 diabetes and high blood pressure. Heart rate and blood pressure. Body temperature. Skin for abnormal spots. What immunizations do I need?  Vaccines are usually given at various ages, according to a schedule. Your health care provider will recommend vaccines for you based on your age, medical history, and  lifestyle or other factors, such as travel or where you work. What tests do I need? Screening Your health care provider may recommend screening tests for certain conditions. This may include: Lipid and cholesterol levels. Diabetes screening. This is done by checking your blood sugar (glucose) after you have not eaten for a while (fasting). Pelvic exam and Pap test. Hepatitis B test. Hepatitis C test. HIV (human immunodeficiency virus) test. STI (sexually transmitted infection) testing, if you are at risk. Lung cancer screening. Colorectal cancer screening. Mammogram. Talk with your health care provider about when you should start having regular mammograms. This may depend on whether you have a family history of breast cancer. BRCA-related cancer screening. This may be done if you have a family history of breast, ovarian, tubal, or peritoneal cancers. Bone density scan. This is done to screen for osteoporosis. Talk with your health care provider about your test results, treatment options, and if necessary, the need for more tests. Follow these instructions at home: Eating and drinking  Eat a diet that includes fresh fruits and vegetables, whole grains, lean protein, and low-fat dairy products. Take vitamin and mineral supplements as recommended by your health care provider. Do not drink alcohol if: Your health care provider tells you not to drink. You are pregnant, may be pregnant, or are planning to become pregnant. If you drink alcohol: Limit how much you have to 0-1 drink a day. Know how much alcohol is in your drink. In the [place] one drink equals one 12 oz bottle of beer (355 mL), one 5 oz glass of wine (148 mL), or one 1 oz glass of hard liquor (44 mL). Lifestyle Brush your teeth every morning and night with  fluoride toothpaste. Floss one time each day. Exercise for at least 30 minutes 5 or more days each week. Do not use any products that contain nicotine or tobacco. These  products include cigarettes, chewing tobacco, and vaping devices, such as e-cigarettes. If you need help quitting, ask your health care provider. Do not use drugs. If you are sexually active, practice safe sex. Use a condom or other form of protection to prevent STIs. If you do not wish to become pregnant, use a form of birth control. If you plan to become pregnant, see your health care provider for a prepregnancy visit. Take aspirin only as told by your health care provider. Make sure that you understand how much to take and what form to take. Work with your health care provider to find out whether it is safe and beneficial for you to take aspirin daily. Find healthy ways to manage stress, such as: Meditation, yoga, or listening to music. Journaling. Talking to a trusted person. Spending time with friends and family. Minimize exposure to UV radiation to reduce your risk of skin cancer. Safety Always wear your seat belt while driving or riding in a vehicle. Do not drive: If you have been drinking alcohol. Do not ride with someone who has been drinking. When you are tired or distracted. While texting. If you have been using any mind-altering substances or drugs. Wear a helmet and other protective equipment during sports activities. If you have firearms in your house, make sure you follow all gun safety procedures. Seek help if you have been physically or sexually abused. What's next? Visit your health care provider once a year for an annual wellness visit. Ask your health care provider how often you should have your eyes and teeth checked. Stay up to date on all vaccines. This information is not intended to replace advice given to you by your health care provider. Make sure you discuss any questions you have with your health care provider. Document Revised: 02/10/2021 Document Reviewed: 02/10/2021 Elsevier Patient Education  [place].

## 2022-11-20 ENCOUNTER — Encounter: Payer: Self-pay | Admitting: Family Medicine

## 2022-11-28 ENCOUNTER — Encounter: Payer: Self-pay | Admitting: Family Medicine

## 2023-01-02 ENCOUNTER — Other Ambulatory Visit: Payer: Self-pay | Admitting: Family Medicine

## 2023-01-02 DIAGNOSIS — I10 Essential (primary) hypertension: Secondary | ICD-10-CM

## 2023-01-06 DIAGNOSIS — Z124 Encounter for screening for malignant neoplasm of cervix: Secondary | ICD-10-CM | POA: Diagnosis not present

## 2023-01-06 DIAGNOSIS — Z01419 Encounter for gynecological examination (general) (routine) without abnormal findings: Secondary | ICD-10-CM | POA: Diagnosis not present

## 2023-01-06 DIAGNOSIS — Z1231 Encounter for screening mammogram for malignant neoplasm of breast: Secondary | ICD-10-CM | POA: Diagnosis not present

## 2023-01-06 LAB — HM MAMMOGRAPHY

## 2023-04-13 ENCOUNTER — Encounter (INDEPENDENT_AMBULATORY_CARE_PROVIDER_SITE_OTHER): Payer: Self-pay

## 2023-05-12 ENCOUNTER — Encounter: Payer: Self-pay | Admitting: Family Medicine

## 2023-05-12 ENCOUNTER — Ambulatory Visit: Payer: BC Managed Care – PPO | Admitting: Family Medicine

## 2023-05-12 VITALS — BP 118/88 | HR 61 | Temp 97.8°F | Wt 131.0 lb

## 2023-05-12 DIAGNOSIS — I10 Essential (primary) hypertension: Secondary | ICD-10-CM

## 2023-05-12 DIAGNOSIS — M25561 Pain in right knee: Secondary | ICD-10-CM

## 2023-05-12 DIAGNOSIS — M25571 Pain in right ankle and joints of right foot: Secondary | ICD-10-CM

## 2023-05-12 DIAGNOSIS — E785 Hyperlipidemia, unspecified: Secondary | ICD-10-CM | POA: Diagnosis not present

## 2023-05-12 LAB — LIPID PANEL
Cholesterol: 206 mg/dL — ABNORMAL HIGH (ref 0–200)
HDL: 73.6 mg/dL (ref >39.00)
LDL Cholesterol: 108 mg/dL — ABNORMAL HIGH (ref 0–99)
NonHDL: 132.08
Total CHOL/HDL Ratio: 3
Triglycerides: 119 mg/dL (ref 0.0–149.0)
VLDL: 23.8 mg/dL (ref 0.0–40.0)

## 2023-05-12 LAB — COMPREHENSIVE METABOLIC PANEL
ALT: 13 U/L (ref 0–35)
AST: 16 U/L (ref 0–37)
Albumin: 4.4 g/dL (ref 3.5–5.2)
Alkaline Phosphatase: 48 U/L (ref 39–117)
BUN: 11 mg/dL (ref 6–23)
CO2: 32 meq/L (ref 19–32)
Calcium: 9.2 mg/dL (ref 8.4–10.5)
Chloride: 99 meq/L (ref 96–112)
Creatinine, Ser: 0.76 mg/dL (ref 0.40–1.20)
GFR: 90.73 mL/min (ref >60.00)
Glucose, Bld: 80 mg/dL (ref 70–99)
Potassium: 3.8 meq/L (ref 3.5–5.1)
Sodium: 138 meq/L (ref 135–145)
Total Bilirubin: 0.4 mg/dL (ref 0.2–1.2)
Total Protein: 7 g/dL (ref 6.0–8.3)

## 2023-05-12 MED ORDER — MELOXICAM 7.5 MG PO TABS: 7.5000 mg | ORAL_TABLET | Freq: Every day | ORAL | 0 refills | Status: DC | PRN

## 2023-05-12 NOTE — Patient Instructions (Signed)
Thanks for coming in today.  No med changes at this time but keep an eye on your blood pressure at home if you can every few weeks or so.  If readings above 130/80, let me know.  Hamstring stretches may be helpful initially as posterior knee pain can commonly come from tight hamstrings.  Some symptoms could be due to a meniscus issue as well but without locking, giving way or swelling I think it is reasonable to try some range of motion stretches for now, anti-inflammatory meloxicam up to once per day for a week or two.  If not improving or any worsening symptoms it may be worthwhile to meet with Ortho, Dr. Althea Charon to decide on imaging, physical therapy or possible injection.  Let me know if I can help in the interim.

## 2023-05-12 NOTE — Progress Notes (Signed)
Subjective:  Patient ID: Brittany Howard, female    DOB: 1971/02/10  Age: 52 y.o. MRN: [id number]  CC:  Chief Complaint  Patient presents with   Medication Management    Generally fine, does have right knee issue would like to discuss.     HPI Brittany Howard presents for   Right knee, R ankle pain: Past few months - stiff with sitting for awhile. Sore after exercise, some popping now. Some popping in ankles and hips at time. Some soreness in R ankle at times, but when knee sore. No typical hip pain. Clicking at times, not giving way, no locking. Posterior knee.  No injury or change in activity. Taekwon Do few times per week. Wears ankle, knee compression sleeves. Prior R ankle sprain few years ago. No recent injuries or change in activity.  Tx: advil or tylenol 1-2 times per day. Min relief.   Hypertension: Amlodipine 5 mg daily without new side effects. Home readings: none.  BP Readings from Last 3 Encounters:  05/12/23 118/88  11/09/22 128/74  12/08/21 132/70   Lab Results  Component Value Date   CREATININE 0.85 11/09/2022    Hyperlipidemia: Mild elevation previously.  No current meds.  Low 10-year ASCVD risk score. Fasting today.  The 10-year ASCVD risk score (Arnett DK, et al., 2019) is: 1.2%   Values used to calculate the score:     Age: 34 years     Sex: Female     Is Non-Hispanic African American: No     Diabetic: No     Tobacco smoker: No     Systolic Blood Pressure: 118 mmHg     Is BP treated: Yes     HDL Cholesterol: 73.6 mg/dL     Total Cholesterol: 212 mg/dL  Lab Results  Component Value Date   CHOL 212 (H) 11/09/2022   HDL 73.60 11/09/2022   LDLCALC 117 (H) 11/09/2022   TRIG 110.0 11/09/2022   CHOLHDL 3 11/09/2022   Lab Results  Component Value Date   ALT 13 11/09/2022   AST 18 11/09/2022   ALKPHOS 48 11/09/2022   BILITOT 0.7 11/09/2022   Health maintenance: Flu vaccine COVID booster Shingles vaccine  -Shingles vaccine scheduled today, she  plans on having flu and COVID-vaccine next 1 month.  Deferred today.  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    Allergies  Allergen Reactions   Gluten Meal    Sulfa Antibiotics Rash   Prior to Admission medications   Medication Sig Start Date End Date Taking? Authorizing Provider  amLODipine (NORVASC) 5 MG tablet TAKE 1 TABLET(5 MG) BY MOUTH DAILY 01/02/23  Yes Shade Flood, MD  XIIDRA 5 % SOLN Apply 1 drop to eye 2 (two) times daily. 01/21/21  Yes [provider]   Social History   Socioeconomic History   Marital status: Married    Spouse name: Not on file   Number of children: Not on file   Years of education: Not on file   Highest education level: Not on file  Occupational History   Not on file  Tobacco Use   Smoking status: Never   Smokeless tobacco: Not on file  Substance and Sexual Activity   Alcohol use: Yes    Alcohol/week: 2.0 - 3.0 standard drinks of alcohol    Types: 2 - 3 Glasses of wine per week    Comment: social   Drug use: No   Sexual activity: Yes  Other Topics Concern   Not on file  Social History Narrative   Not on file   Social Determinants of Health   Financial Resource Strain: Not on file  Food Insecurity: Not on file  Transportation Needs: Not on file  Physical Activity: Not on file  Stress: Not on file  Social Connections: Not on file  Intimate Partner Violence: Not on file    Review of Systems   Objective:   Vitals:   05/12/23 0816  BP: 118/88  Pulse: 61  Temp: 97.8 F (36.6 C)  TempSrc: Temporal  SpO2: 98%  Weight: 131 lb (59.4 kg)     Physical Exam Vitals reviewed.  Constitutional:      Appearance: Normal appearance. She is  well-developed.  HENT:     Head: Normocephalic and atraumatic.  Eyes:     Conjunctiva/sclera: Conjunctivae normal.     Pupils: Pupils are equal, round, and reactive to light.  Neck:     Vascular: No carotid bruit.  Cardiovascular:     Rate and Rhythm: Normal rate and regular rhythm.     Heart sounds: Normal heart sounds.  Pulmonary:     Effort: Pulmonary effort is normal.     Breath sounds: Normal breath sounds.  Abdominal:     Palpations: Abdomen is soft. There is no pulsatile mass.     Tenderness: There is no abdominal tenderness.  Musculoskeletal:     Right lower leg: No edema.     Left lower leg: No edema.     Comments: Right knee, skin intact, no erythema, pain-free range of motion.  No effusion.  No joint line tenderness, patellar nontender.  Negative Lachman, McMurray, varus and valgus testing.  Slight discomfort over the distal hamstrings with stretch, and somewhat tight hamstrings.  Right ankle, pain-free range of motion, no bony tenderness including navicular, fifth metatarsal of foot.  Achilles nontender.  Negative drawer, talar tilt  Skin:    General: Skin is warm and dry.  Neurological:     Mental Status: She is alert and oriented to person, place, and time.  Psychiatric:        Mood and Affect: Mood normal.        Behavior: Behavior normal.     Assessment & Plan:  Brittany Howard is a 52 y.o. female . Essential hypertension - Plan: Comprehensive metabolic panel  -Borderline diastolic, monitor home readings, continue same dose amlodipine for now.  Check labs.  Hyperlipidemia, unspecified hyperlipidemia type - Plan: Comprehensive metabolic panel, Lipid panel  -Mild elevation previously, low ASCVD risk score, fasting today, will check updated levels but no new meds for now.  Right knee pain, unspecified chronicity - Plan: meloxicam (MOBIC) 7.5 MG tablet  -Suspect component of tight hamstrings given posterior pain but with clicking, other symptoms above may have some  degenerative meniscal disease.  Overall reassuring exam.  Hamstring stretches for now, short-term meloxicam, consider imaging, trial of PT, or injection, but Ortho follow-up if not improving.  Right ankle pain, unspecified chronicity - Plan: meloxicam (MOBIC) 7.5 MG tablet  -Reassuring exam, notes ankle symptoms with knee, question change in motion with favoring knee and prior ankle injury.  Meloxicam as above, RTC precautions.  Hold on  imaging for now without recent injury.  Meds ordered this encounter  Medications   meloxicam (MOBIC) 7.5 MG tablet    Sig: Take 1 tablet (7.5 mg total) by mouth daily as needed for pain.    Dispense:  30 tablet    Refill:  0   Patient Instructions  Thanks for coming in today.  No med changes at this time but keep an eye on your blood pressure at home if you can every few weeks or so.  If readings above 130/80, let me know.  Hamstring stretches may be helpful initially as posterior knee pain can commonly come from tight hamstrings.  Some symptoms could be due to a meniscus issue as well but without locking, giving way or swelling I think it is reasonable to try some range of motion stretches for now, anti-inflammatory meloxicam up to once per day for a week or two.  If not improving or any worsening symptoms it may be worthwhile to meet with Ortho, Dr. Althea Charon to decide on imaging, physical therapy or possible injection.  Let me know if I can help in the interim.     Signed,   Meredith Staggers, MD [facility], [facility] 05/12/23 9:12 AM

## 2023-05-13 ENCOUNTER — Encounter: Payer: Self-pay | Admitting: Family Medicine

## 2023-06-08 ENCOUNTER — Other Ambulatory Visit: Payer: Self-pay | Admitting: Family Medicine

## 2023-06-08 DIAGNOSIS — M25561 Pain in right knee: Secondary | ICD-10-CM

## 2023-06-08 DIAGNOSIS — M25571 Pain in right ankle and joints of right foot: Secondary | ICD-10-CM

## 2023-06-08 NOTE — Telephone Encounter (Signed)
Patient is requesting a refill of the following medications: Requested Prescriptions   Pending Prescriptions Disp Refills   meloxicam (MOBIC) 7.5 MG tablet [Pharmacy Med Name: MELOXICAM 7.5MG  TABLETS] 30 tablet 0    Sig: TAKE 1 TABLET(7.5 MG) BY MOUTH DAILY AS NEEDED FOR PAIN    Date of patient request: 06/08/23 Last office visit: 05/12/23 Date of last refill: 05/12/23 Last refill amount: 30 Follow up time period per chart: 6 months

## 2023-06-09 ENCOUNTER — Other Ambulatory Visit: Payer: Self-pay

## 2023-06-09 DIAGNOSIS — M25571 Pain in right ankle and joints of right foot: Secondary | ICD-10-CM

## 2023-06-09 DIAGNOSIS — M25561 Pain in right knee: Secondary | ICD-10-CM

## 2023-06-09 MED ORDER — MELOXICAM 7.5 MG PO TABS: 7.5000 mg | ORAL_TABLET | Freq: Every day | ORAL | 0 refills | Status: DC | PRN

## 2023-06-15 DIAGNOSIS — H524 Presbyopia: Secondary | ICD-10-CM | POA: Diagnosis not present

## 2023-06-16 ENCOUNTER — Encounter: Payer: Self-pay | Admitting: Family Medicine

## 2023-07-12 ENCOUNTER — Other Ambulatory Visit: Payer: Self-pay | Admitting: Family Medicine

## 2023-07-12 DIAGNOSIS — M25561 Pain in right knee: Secondary | ICD-10-CM

## 2023-07-12 DIAGNOSIS — M25571 Pain in right ankle and joints of right foot: Secondary | ICD-10-CM

## 2023-07-19 DIAGNOSIS — H10023 Other mucopurulent conjunctivitis, bilateral: Secondary | ICD-10-CM | POA: Diagnosis not present

## 2023-07-19 DIAGNOSIS — H04123 Dry eye syndrome of bilateral lacrimal glands: Secondary | ICD-10-CM | POA: Diagnosis not present

## 2023-07-19 DIAGNOSIS — H35033 Hypertensive retinopathy, bilateral: Secondary | ICD-10-CM | POA: Diagnosis not present

## 2023-11-15 ENCOUNTER — Ambulatory Visit (INDEPENDENT_AMBULATORY_CARE_PROVIDER_SITE_OTHER): Payer: BC Managed Care – PPO | Admitting: Family Medicine

## 2023-11-15 ENCOUNTER — Encounter: Payer: Self-pay | Admitting: Family Medicine

## 2023-11-15 VITALS — BP 124/64 | HR 65 | Temp 98.0°F | Ht 64.0 in | Wt 126.8 lb

## 2023-11-15 DIAGNOSIS — I10 Essential (primary) hypertension: Secondary | ICD-10-CM | POA: Diagnosis not present

## 2023-11-15 DIAGNOSIS — Z131 Encounter for screening for diabetes mellitus: Secondary | ICD-10-CM | POA: Diagnosis not present

## 2023-11-15 DIAGNOSIS — E785 Hyperlipidemia, unspecified: Secondary | ICD-10-CM | POA: Diagnosis not present

## 2023-11-15 DIAGNOSIS — Z130 Encounter for screening for diseases of the blood and blood-forming organs and certain disorders involving the immune mechanism: Secondary | ICD-10-CM | POA: Diagnosis not present

## 2023-11-15 DIAGNOSIS — Z0000 Encounter for general adult medical examination without abnormal findings: Secondary | ICD-10-CM | POA: Diagnosis not present

## 2023-11-15 DIAGNOSIS — Z1211 Encounter for screening for malignant neoplasm of colon: Secondary | ICD-10-CM

## 2023-11-15 LAB — CBC WITH DIFFERENTIAL/PLATELET
Basophils Absolute: 0.1 10*3/uL (ref 0.0–0.1)
Basophils Relative: 0.9 % (ref 0.0–3.0)
Eosinophils Absolute: 0.1 10*3/uL (ref 0.0–0.7)
Eosinophils Relative: 1.9 % (ref 0.0–5.0)
HCT: 42.6 % (ref 36.0–46.0)
Hemoglobin: 13.8 g/dL (ref 12.0–15.0)
Lymphocytes Relative: 30.6 % (ref 12.0–46.0)
Lymphs Abs: 2.3 10*3/uL (ref 0.7–4.0)
MCHC: 32.4 g/dL (ref 30.0–36.0)
MCV: 92.1 fl (ref 78.0–100.0)
Monocytes Absolute: 0.4 10*3/uL (ref 0.1–1.0)
Monocytes Relative: 6 % (ref 3.0–12.0)
Neutro Abs: 4.5 10*3/uL (ref 1.4–7.7)
Neutrophils Relative %: 60.6 % (ref 43.0–77.0)
Platelets: 361 10*3/uL (ref 150.0–400.0)
RBC: 4.63 Mil/uL (ref 3.87–5.11)
RDW: 13 % (ref 11.5–15.5)
WBC: 7.4 10*3/uL (ref 4.0–10.5)

## 2023-11-15 LAB — COMPREHENSIVE METABOLIC PANEL
ALT: 24 U/L (ref 0–35)
AST: 24 U/L (ref 0–37)
Albumin: 4.7 g/dL (ref 3.5–5.2)
Alkaline Phosphatase: 54 U/L (ref 39–117)
BUN: 13 mg/dL (ref 6–23)
CO2: 29 meq/L (ref 19–32)
Calcium: 9.9 mg/dL (ref 8.4–10.5)
Chloride: 101 meq/L (ref 96–112)
Creatinine, Ser: 0.72 mg/dL (ref 0.40–1.20)
GFR: 96.47 mL/min (ref >60.00)
Glucose, Bld: 82 mg/dL (ref 70–99)
Potassium: 3.9 meq/L (ref 3.5–5.1)
Sodium: 136 meq/L (ref 135–145)
Total Bilirubin: 0.5 mg/dL (ref 0.2–1.2)
Total Protein: 7.4 g/dL (ref 6.0–8.3)

## 2023-11-15 LAB — HEMOGLOBIN A1C: Hgb A1c MFr Bld: 5.6 % (ref 4.6–6.5)

## 2023-11-15 LAB — LIPID PANEL
Cholesterol: 224 mg/dL — ABNORMAL HIGH (ref 0–200)
HDL: 63.5 mg/dL (ref >39.00)
LDL Cholesterol: 138 mg/dL — ABNORMAL HIGH (ref 0–99)
NonHDL: 160.21
Total CHOL/HDL Ratio: 4
Triglycerides: 113 mg/dL (ref 0.0–149.0)
VLDL: 22.6 mg/dL (ref 0.0–40.0)

## 2023-11-15 MED ORDER — AMLODIPINE BESYLATE 5 MG PO TABS: 5.0000 mg | ORAL_TABLET | Freq: Every day | ORAL | 3 refills | Status: DC

## 2023-11-15 NOTE — Progress Notes (Signed)
 Subjective:  Patient ID: Brittany Howard, female    DOB: [birth year]  Age: 53 y.o. MRN: [id number]  CC:  Chief Complaint  Patient presents with   Annual Exam    Pt is well no questions, notes she is fasting    Health Maintenance    Patient sees Dr Verlee Monte for paps and mammos and is due in May, agreed to next cologaurd test, states she has had shingrix at [facility]     HPI Brittany Howard presents for Annual Exam  Doing well, no concerns today.  Recent trip to [place], daughter will be graduating from college soon, planning  vet school, waitlisted at [employer].  PCP:[facility], Dr. Billy Coast, Pap and mammogram at [facility], appointment in May. Dermatology, Dr. Leonie Man  Flu last week - recovered now - back to normal. Some allergies at times - claritin and flonase otc controls.   Hypertension: Treated with amlodipine 5 mg daily.  Started in 2023. Prior elevated cholesterol with low ASCVD risk score, no statin.  No new side effects with amlodipine, no new swelling. Home readings: none.  BP Readings from Last 3 Encounters:  11/15/23 124/64  05/12/23 118/88  11/09/22 128/74   Lab Results  Component Value Date   CREATININE 0.76 05/12/2023       11/15/2023    8:09 AM 05/12/2023    8:54 AM 11/09/2022    8:02 AM 12/08/2021    8:34 AM 11/03/2021    9:22 AM  Depression screen PHQ 2/9  Decreased Interest 0 0 0 0 0  Down, Depressed, Hopeless 0 0 0 0 0  PHQ - 2 Score 0 0 0 0 0  Altered sleeping 0 0 0  0  Tired, decreased energy 0 0 0  0  Change in appetite 0 0 0  0  Feeling bad or failure about yourself  0 0 0  0  Trouble concentrating 0 0 0  0  Moving slowly or fidgety/restless 0 0 0  0  Suicidal thoughts 0 0 0  0  PHQ-9 Score 0 0 0  0    Health Maintenance  Topic Date Due   Cervical Cancer Screening (HPV/Pap Cotest)  02/07/2014   Colonoscopy  12/08/2023   MAMMOGRAM  01/05/2025   DTaP/Tdap/Td (4 - Td or Tdap) 11/04/2031   INFLUENZA VACCINE  Completed   COVID-19 Vaccine   Completed   Hepatitis C Screening  Completed   HIV Screening  Completed   Zoster Vaccines- Shingrix  Completed   HPV VACCINES  Aged Out  Repeat Cologuard planned for colon cancer screening.Previously negative in April 2022. Pap and mammogram through GYN.  Immunization History  Administered Date(s) Administered   Hepatitis A, Ped/Adol-2 Dose 11/17/2022   Influenza,inj,Quad PF,6+ Mos 05/28/2013, 06/11/2014   Influenza-Unspecified 06/18/2021, 08/19/2022, 06/15/2023   PFIZER(Purple Top)SARS-COV-2 Vaccination 11/15/2019, 12/06/2019, 10/26/2020, 08/19/2022   Pfizer Covid-19 Vaccine Bivalent Booster 12yrs & up 06/18/2021   Tdap 07/31/2006, 08/24/2018, 11/03/2021   Typhoid Live 11/18/2022   Unspecified SARS-COV-2 Vaccination 06/15/2023   Zoster Recombinant(Shingrix) 11/25/2022, 05/12/2023   Zoster, Unspecified 05/12/2023  Second Shingrix was at pharmacy   No results found. Optho - every October. No vision changes. New rx last visit, dry eye - Rx Xiidra.   Dental: every 6 months. Appt 4/2.   Alcohol: 5-6 per week.   Tobacco: none. No vaping.   Exercise:Taekwondo 3 days per week.    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    Allergies  Allergen Reactions   Gluten Meal    Sulfa Antibiotics Rash   Prior to Admission medications   Medication Sig Start Date End Date Taking? Authorizing Provider  amLODipine (NORVASC) 5 MG tablet TAKE 1 TABLET(5 MG) BY MOUTH DAILY 01/02/23  Yes Shade Flood, MD  meloxicam (MOBIC) 7.5 MG tablet TAKE 1 TABLET(7.5 MG) BY MOUTH DAILY AS NEEDED FOR PAIN 07/12/23  Yes Shade Flood, MD  XIIDRA 5 % SOLN Apply 1 drop to eye 2 (two) times daily. 01/21/21  Yes [provider]   Social History   Socioeconomic History   Marital status: Married    Spouse name: Not on file   Number of children: Not on file   Years of education: Not on file   Highest education level: Master's degree (e.g., MA, MS, MEng, MEd, MSW, MBA)  Occupational History   Not on file  Tobacco Use   Smoking status: Never   Smokeless tobacco: Not on file  Substance and Sexual Activity   Alcohol use: Yes    Alcohol/week: 2.0 - 3.0 standard drinks of alcohol    Types: 2 - 3 Glasses of wine per week    Comment: social   Drug use: No   Sexual activity: Yes  Other Topics Concern   Not on file  Social History Narrative   Not on file   Social Drivers of Health   Financial Resource Strain: Low Risk  (11/12/2023)   Overall Financial Resource Strain (CARDIA)    Difficulty of Paying Living Expenses: Not hard at all  Food Insecurity: No Food Insecurity (11/12/2023)   Hunger Vital Sign    Worried About Running Out of Food in the Last Year: Never true    Ran Out of Food in the Last Year: Never true  Transportation Needs: No Transportation Needs (11/12/2023)   PRAPARE - Administrator, Civil Service (Medical): No    Lack of Transportation (Non-Medical): No  Physical Activity: Sufficiently Active (11/12/2023)   Exercise Vital Sign    Days of Exercise per Week: 3 days    Minutes of Exercise per Session: 50 min  Stress: No Stress Concern Present (11/12/2023)   [employer] of Occupational Health - Occupational Stress Questionnaire    Feeling of Stress : Only a little  Social Connections: Moderately Integrated (11/12/2023)   Social Connection and Isolation Panel [NHANES]    Frequency of Communication with Friends and Family: More than three times a week    Frequency of Social Gatherings with Friends and Family: More than three times a week    Attends Religious Services: Never    Database administrator or Organizations: Yes    Attends Engineer, structural:  More than 4 times per year    Marital Status: Married  Catering manager Violence: Not on file    Review of Systems 13 point review of systems per patient health survey noted.  Negative other than as indicated above or in HPI.    Objective:   Vitals:   11/15/23 0810  BP: 124/64  Pulse: 65  Temp: 98 F (36.7 C)  TempSrc: Temporal  SpO2: 98%  Weight: 126 lb 12.8 oz (57.5 kg)  Height: 5\' 4"  (1.626 m)     Physical Exam Vitals reviewed.  Constitutional:      Appearance: She is well-developed.  HENT:     Head: Normocephalic and atraumatic.     Right Ear: External ear normal.     Left Ear: External ear normal.  Eyes:     Conjunctiva/sclera: Conjunctivae normal.     Pupils: Pupils are equal, round, and reactive to light.  Neck:     Thyroid: No thyromegaly.  Cardiovascular:     Rate and Rhythm: Normal rate and regular rhythm.     Heart sounds: Normal heart sounds. No murmur heard. Pulmonary:     Effort: Pulmonary effort is normal. No respiratory distress.     Breath sounds: Normal breath sounds. No wheezing.  Abdominal:     General: Bowel sounds are normal.     Palpations: Abdomen is soft.     Tenderness: There is no abdominal tenderness.  Musculoskeletal:        General: No tenderness. Normal range of motion.     Cervical back: Normal range of motion and neck supple.  Lymphadenopathy:     Cervical: No cervical adenopathy.  Skin:    General: Skin is warm and dry.     Findings: No rash.  Neurological:     Mental Status: She is alert and oriented to person, place, and time.  Psychiatric:        Behavior: Behavior normal.        Thought Content: Thought content normal.        Assessment & Plan:  Brittany Howard is a 53 y.o. female . Annual physical exam  - -anticipatory guidance as below in AVS, screening labs above. Health maintenance items as above in HPI discussed/recommended as applicable.   Screening for diabetes mellitus - Plan: Hemoglobin A1c,  Comprehensive metabolic panel  Screening for deficiency anemia - Plan: CBC with Differential/Platelet  Essential hypertension - Plan: CBC with Differential/Platelet, amLODipine (NORVASC) 5 MG tablet  -  Stable, tolerating current regimen. Medications refilled. Labs pending as above.   Hyperlipidemia, unspecified hyperlipidemia type - Plan: Lipid panel  -Check labs, low ASCVD risk were previously.  No new meds.  Screen for colon cancer - Plan: Cologuard  -Option of Cologuard versus colonoscopy including timing of potential repeat testing, potential false positive Cologuard and need for colonoscopy if that were to occur.  She would like to have Cologuard again, ordered, can be sent in next month as 3 years will be due in April.  Meds ordered this encounter  Medications   amLODipine (NORVASC) 5 MG tablet    Sig: Take 1 tablet (5 mg total) by mouth daily. TAKE 1 TABLET(5 MG) BY MOUTH DAILY    Dispense:  90 tablet    Refill:  3   Patient Instructions  Thank you for coming in today.  Blood pressure looks good, no change in amlodipine dose for now.  Follow-up in 6 months, but if blood pressure remains stable over the next 6 months to a  year, we may be able to shift over to just yearly visits for your physical.  I will let you know if any concerns on your labs.  Keep follow-up with GYN as planned.  Let me know if there are questions and take care.    Preventive Care 40-45 Years Old, Female Preventive care refers to lifestyle choices and visits with your health care provider that can promote health and wellness. Preventive care visits are also called  wellness exams. What can I expect for my preventive care visit? Counseling Your health care provider may ask you questions about your: Medical history, including: Past medical problems. Family medical history. Pregnancy history. Current health, including: Menstrual cycle. Method of birth control. Emotional well-being. Home life and  relationship well-being. Sexual activity and sexual health. Lifestyle, including: Alcohol, nicotine or tobacco, and drug use. Access to firearms. Diet, exercise, and sleep habits. Work and work Astronomer. Sunscreen use. Safety issues such as seatbelt and bike helmet use. Physical exam Your health care provider will check your: Height and weight. These may be used to calculate your BMI (body mass index). BMI is a measurement that tells if you are at a healthy weight. Waist circumference. This measures the distance around your waistline. This measurement also tells if you are at a healthy weight and may help predict your risk of certain diseases, such as type 2 diabetes and high blood pressure. Heart rate and blood pressure. Body temperature. Skin for abnormal spots. What immunizations do I need?  Vaccines are usually given at various ages, according to a schedule. Your health care provider will recommend vaccines for you based on your age, medical history, and lifestyle or other factors, such as travel or where you work. What tests do I need? Screening Your health care provider may recommend screening tests for certain conditions. This may include: Lipid and cholesterol levels. Diabetes screening. This is done by checking your blood sugar (glucose) after you have not eaten for a while (fasting). Pelvic exam and Pap test. Hepatitis B test. Hepatitis C test. HIV (human immunodeficiency virus) test. STI (sexually transmitted infection) testing, if you are at risk. Lung cancer screening. Colorectal cancer screening. Mammogram. Talk with your health care provider about when you should start having regular mammograms. This may depend on whether you have a family history of breast cancer. BRCA-related cancer screening. This may be done if you have a family history of breast, ovarian, tubal, or peritoneal cancers. Bone density scan. This is done to screen for osteoporosis. Talk with your  health care provider about your test results, treatment options, and if necessary, the need for more tests. Follow these instructions at home: Eating and drinking  Eat a diet that includes fresh fruits and vegetables, whole grains, lean protein, and low-fat dairy products. Take vitamin and mineral supplements as recommended by your health care provider. Do not drink alcohol if: Your health care provider tells you not to drink. You are pregnant, may be pregnant, or are planning to become pregnant. If you drink alcohol: Limit how much you have to 0-1 drink a day. Know how much alcohol is in your drink. In the [place] one drink equals one 12 oz bottle of beer (355 mL), one 5 oz glass of wine (148 mL), or one 1 oz glass of hard liquor (44 mL). Lifestyle Brush your teeth every morning and night with fluoride toothpaste. Floss one time each day. Exercise for at least 30 minutes 5 or more days each week. Do not use any products that contain nicotine or tobacco. These products include cigarettes, chewing tobacco, and vaping devices, such as e-cigarettes. If you need help quitting, ask your health care provider. Do not use drugs. If you are sexually active, practice safe sex. Use a condom or other form of protection to prevent STIs. If you do not wish to become pregnant, use a form of birth control. If you plan to become pregnant, see your health care provider for a prepregnancy  visit. Take aspirin only as told by your health care provider. Make sure that you understand how much to take and what form to take. Work with your health care provider to find out whether it is safe and beneficial for you to take aspirin daily. Find healthy ways to manage stress, such as: Meditation, yoga, or listening to music. Journaling. Talking to a trusted person. Spending time with friends and family. Minimize exposure to UV radiation to reduce your risk of skin cancer. Safety Always wear your seat belt while driving  or riding in a vehicle. Do not drive: If you have been drinking alcohol. Do not ride with someone who has been drinking. When you are tired or distracted. While texting. If you have been using any mind-altering substances or drugs. Wear a helmet and other protective equipment during sports activities. If you have firearms in your house, make sure you follow all gun safety procedures. Seek help if you have been physically or sexually abused. What's next? Visit your health care provider once a year for an annual wellness visit. Ask your health care provider how often you should have your eyes and teeth checked. Stay up to date on all vaccines. This information is not intended to replace advice given to you by your health care provider. Make sure you discuss any questions you have with your health care provider. Document Revised: 02/10/2021 Document Reviewed: 02/10/2021 Elsevier Patient Education  2024 Elsevier Inc.     Signed,   Meredith Staggers, MD [facility], [facility] 11/15/23 8:36 AM

## 2023-11-15 NOTE — Patient Instructions (Addendum)
 Thank you for coming in today.  Blood pressure looks good, no change in amlodipine dose for now.  Follow-up in 6 months, but if blood pressure remains stable over the next 6 months to a  year, we may be able to shift over to just yearly visits for your physical.  I will let you know if any concerns on your labs.  Keep follow-up with GYN as planned.  Let me know if there are questions and take care.    Preventive Care 7-52 Years Old, Female Preventive care refers to lifestyle choices and visits with your health care provider that can promote health and wellness. Preventive care visits are also called wellness exams. What can I expect for my preventive care visit? Counseling Your health care provider may ask you questions about your: Medical history, including: Past medical problems. Family medical history. Pregnancy history. Current health, including: Menstrual cycle. Method of birth control. Emotional well-being. Home life and relationship well-being. Sexual activity and sexual health. Lifestyle, including: Alcohol, nicotine or tobacco, and drug use. Access to firearms. Diet, exercise, and sleep habits. Work and work Astronomer. Sunscreen use. Safety issues such as seatbelt and bike helmet use. Physical exam Your health care provider will check your: Height and weight. These may be used to calculate your BMI (body mass index). BMI is a measurement that tells if you are at a healthy weight. Waist circumference. This measures the distance around your waistline. This measurement also tells if you are at a healthy weight and may help predict your risk of certain diseases, such as type 2 diabetes and high blood pressure. Heart rate and blood pressure. Body temperature. Skin for abnormal spots. What immunizations do I need?  Vaccines are usually given at various ages, according to a schedule. Your health care provider will recommend vaccines for you based on your age, medical history,  and lifestyle or other factors, such as travel or where you work. What tests do I need? Screening Your health care provider may recommend screening tests for certain conditions. This may include: Lipid and cholesterol levels. Diabetes screening. This is done by checking your blood sugar (glucose) after you have not eaten for a while (fasting). Pelvic exam and Pap test. Hepatitis B test. Hepatitis C test. HIV (human immunodeficiency virus) test. STI (sexually transmitted infection) testing, if you are at risk. Lung cancer screening. Colorectal cancer screening. Mammogram. Talk with your health care provider about when you should start having regular mammograms. This may depend on whether you have a family history of breast cancer. BRCA-related cancer screening. This may be done if you have a family history of breast, ovarian, tubal, or peritoneal cancers. Bone density scan. This is done to screen for osteoporosis. Talk with your health care provider about your test results, treatment options, and if necessary, the need for more tests. Follow these instructions at home: Eating and drinking  Eat a diet that includes fresh fruits and vegetables, whole grains, lean protein, and low-fat dairy products. Take vitamin and mineral supplements as recommended by your health care provider. Do not drink alcohol if: Your health care provider tells you not to drink. You are pregnant, may be pregnant, or are planning to become pregnant. If you drink alcohol: Limit how much you have to 0-1 drink a day. Know how much alcohol is in your drink. In the [place] one drink equals one 12 oz bottle of beer (355 mL), one 5 oz glass of wine (148 mL), or one 1 oz glass of  hard liquor (44 mL). Lifestyle Brush your teeth every morning and night with fluoride toothpaste. Floss one time each day. Exercise for at least 30 minutes 5 or more days each week. Do not use any products that contain nicotine or tobacco. These  products include cigarettes, chewing tobacco, and vaping devices, such as e-cigarettes. If you need help quitting, ask your health care provider. Do not use drugs. If you are sexually active, practice safe sex. Use a condom or other form of protection to prevent STIs. If you do not wish to become pregnant, use a form of birth control. If you plan to become pregnant, see your health care provider for a prepregnancy visit. Take aspirin only as told by your health care provider. Make sure that you understand how much to take and what form to take. Work with your health care provider to find out whether it is safe and beneficial for you to take aspirin daily. Find healthy ways to manage stress, such as: Meditation, yoga, or listening to music. Journaling. Talking to a trusted person. Spending time with friends and family. Minimize exposure to UV radiation to reduce your risk of skin cancer. Safety Always wear your seat belt while driving or riding in a vehicle. Do not drive: If you have been drinking alcohol. Do not ride with someone who has been drinking. When you are tired or distracted. While texting. If you have been using any mind-altering substances or drugs. Wear a helmet and other protective equipment during sports activities. If you have firearms in your house, make sure you follow all gun safety procedures. Seek help if you have been physically or sexually abused. What's next? Visit your health care provider once a year for an annual wellness visit. Ask your health care provider how often you should have your eyes and teeth checked. Stay up to date on all vaccines. This information is not intended to replace advice given to you by your health care provider. Make sure you discuss any questions you have with your health care provider. Document Revised: 02/10/2021 Document Reviewed: 02/10/2021 Elsevier Patient Education  2024 [employer].

## 2023-11-18 ENCOUNTER — Encounter: Payer: Self-pay | Admitting: Family Medicine

## 2023-12-11 DIAGNOSIS — Z1211 Encounter for screening for malignant neoplasm of colon: Secondary | ICD-10-CM | POA: Diagnosis not present

## 2023-12-18 LAB — COLOGUARD: COLOGUARD: NEGATIVE

## 2023-12-31 ENCOUNTER — Telehealth: Admitting: Family Medicine

## 2023-12-31 DIAGNOSIS — B9689 Other specified bacterial agents as the cause of diseases classified elsewhere: Secondary | ICD-10-CM | POA: Diagnosis not present

## 2023-12-31 DIAGNOSIS — J019 Acute sinusitis, unspecified: Secondary | ICD-10-CM | POA: Diagnosis not present

## 2023-12-31 MED ORDER — AMOXICILLIN-POT CLAVULANATE 875-125 MG PO TABS: 1.0000 | ORAL_TABLET | Freq: Two times a day (BID) | ORAL | 0 refills | Status: DC

## 2023-12-31 NOTE — Progress Notes (Signed)

## 2024-01-09 DIAGNOSIS — Z1231 Encounter for screening mammogram for malignant neoplasm of breast: Secondary | ICD-10-CM | POA: Diagnosis not present

## 2024-01-09 DIAGNOSIS — Z01419 Encounter for gynecological examination (general) (routine) without abnormal findings: Secondary | ICD-10-CM | POA: Diagnosis not present

## 2024-01-09 DIAGNOSIS — Z1331 Encounter for screening for depression: Secondary | ICD-10-CM | POA: Diagnosis not present

## 2024-01-09 DIAGNOSIS — Z124 Encounter for screening for malignant neoplasm of cervix: Secondary | ICD-10-CM | POA: Diagnosis not present

## 2024-01-09 DIAGNOSIS — N914 Secondary oligomenorrhea: Secondary | ICD-10-CM | POA: Diagnosis not present

## 2024-01-11 LAB — HM PAP SMEAR

## 2024-05-16 ENCOUNTER — Ambulatory Visit: Admitting: Family Medicine

## 2024-05-16 VITALS — BP 144/92 | HR 58 | Temp 97.7°F | Ht 64.0 in | Wt 132.8 lb

## 2024-05-16 DIAGNOSIS — Z23 Encounter for immunization: Secondary | ICD-10-CM | POA: Diagnosis not present

## 2024-05-16 DIAGNOSIS — E785 Hyperlipidemia, unspecified: Secondary | ICD-10-CM | POA: Diagnosis not present

## 2024-05-16 DIAGNOSIS — I10 Essential (primary) hypertension: Secondary | ICD-10-CM | POA: Diagnosis not present

## 2024-05-16 LAB — COMPREHENSIVE METABOLIC PANEL WITH GFR
ALT: 12 U/L (ref 0–35)
AST: 18 U/L (ref 0–37)
Albumin: 4.7 g/dL (ref 3.5–5.2)
Alkaline Phosphatase: 44 U/L (ref 39–117)
BUN: 15 mg/dL (ref 6–23)
CO2: 31 meq/L (ref 19–32)
Calcium: 9.6 mg/dL (ref 8.4–10.5)
Chloride: 102 meq/L (ref 96–112)
Creatinine, Ser: 0.79 mg/dL (ref 0.40–1.20)
GFR: 86 mL/min (ref >60.00)
Glucose, Bld: 83 mg/dL (ref 70–99)
Potassium: 4.2 meq/L (ref 3.5–5.1)
Sodium: 140 meq/L (ref 135–145)
Total Bilirubin: 0.6 mg/dL (ref 0.2–1.2)
Total Protein: 7 g/dL (ref 6.0–8.3)

## 2024-05-16 LAB — LIPID PANEL
Cholesterol: 219 mg/dL — ABNORMAL HIGH (ref 0–200)
HDL: 74 mg/dL (ref >39.00)
LDL Cholesterol: 130 mg/dL — ABNORMAL HIGH (ref 0–99)
NonHDL: 145.11
Total CHOL/HDL Ratio: 3
Triglycerides: 77 mg/dL (ref 0.0–149.0)
VLDL: 15.4 mg/dL (ref 0.0–40.0)

## 2024-05-16 NOTE — Patient Instructions (Addendum)
 Thank you for coming in today. No change in medications at this time, but keep a record of your blood pressures outside of the office and if remains elevated we may need to adjust the dose of your meds. Send me some readings in the next few weeks.  It was running a little higher in office today. If there are any concerns on your bloodwork, I will let you know. Have a safe trip and take care!  Managing Your Hypertension Hypertension, also called high blood pressure, is when the force of the blood pressing against the walls of the arteries is too strong. Arteries are blood vessels that carry blood from your heart throughout your body. Hypertension forces the heart to work harder to pump blood and may cause the arteries to become narrow or stiff. Understanding blood pressure readings A blood pressure reading includes a higher number over a lower number: The first, or top, number is called the systolic pressure. It is a measure of the pressure in your arteries as your heart beats. The second, or bottom number, is called the diastolic pressure. It is a measure of the pressure in your arteries as the heart relaxes. For most people, a normal blood pressure is below 120/80. Your personal target blood pressure may vary depending on your medical conditions, your age, and other factors. Blood pressure is classified into four stages. Based on your blood pressure reading, your health care provider may use the following stages to determine what type of treatment you need, if any. Systolic pressure and diastolic pressure are measured in a unit called millimeters of mercury (mmHg). Normal Systolic pressure: below 120. Diastolic pressure: below 80. Elevated Systolic pressure: 120-129. Diastolic pressure: below 80. Hypertension stage 1 Systolic pressure: 130-139. Diastolic pressure: 80-89. Hypertension stage 2 Systolic pressure: 140 or above. Diastolic pressure: 90 or above. How can this condition affect  me? Managing your hypertension is very important. Over time, hypertension can damage the arteries and decrease blood flow to parts of the body, including the brain, heart, and kidneys. Having untreated or uncontrolled hypertension can lead to: A heart attack. A stroke. A weakened blood vessel (aneurysm). Heart failure. Kidney damage. Eye damage. Memory and concentration problems. Vascular dementia. What actions can I take to manage this condition? Hypertension can be managed by making lifestyle changes and possibly by taking medicines. Your health care provider will help you make a plan to bring your blood pressure within a normal range. You may be referred for counseling on a healthy diet and physical activity. Nutrition  Eat a diet that is high in fiber and potassium, and low in salt (sodium), added sugar, and fat. An example eating plan is called the DASH diet. DASH stands for Dietary Approaches to Stop Hypertension. To eat this way: Eat plenty of fresh fruits and vegetables. Try to fill one-half of your plate at each meal with fruits and vegetables. Eat whole grains, such as whole-wheat pasta, brown rice, or whole-grain bread. Fill about one-fourth of your plate with whole grains. Eat low-fat dairy products. Avoid fatty cuts of meat, processed or cured meats, and poultry with skin. Fill about one-fourth of your plate with lean proteins such as fish, chicken without skin, beans, eggs, and tofu. Avoid pre-made and processed foods. These tend to be higher in sodium, added sugar, and fat. Reduce your daily sodium intake. Many people with hypertension should eat less than 1,500 mg of sodium a day. Lifestyle  Work with your health care provider to maintain a  healthy body weight or to lose weight. Ask what an ideal weight is for you. Get at least 30 minutes of exercise that causes your heart to beat faster (aerobic exercise) most days of the week. Activities may include walking, swimming, or  biking. Include exercise to strengthen your muscles (resistance exercise), such as weight lifting, as part of your weekly exercise routine. Try to do these types of exercises for 30 minutes at least 3 days a week. Do not use any products that contain nicotine or tobacco. These products include cigarettes, chewing tobacco, and vaping devices, such as e-cigarettes. If you need help quitting, ask your health care provider. Control any long-term (chronic) conditions you have, such as high cholesterol or diabetes. Identify your sources of stress and find ways to manage stress. This may include meditation, deep breathing, or making time for fun activities. Alcohol use Do not drink alcohol if: Your health care provider tells you not to drink. You are pregnant, may be pregnant, or are planning to become pregnant. If you drink alcohol: Limit how much you have to: 0-1 drink a day for women. 0-2 drinks a day for men. Know how much alcohol is in your drink. In the [place] one drink equals one 12 oz bottle of beer (355 mL), one 5 oz glass of wine (148 mL), or one 1 oz glass of hard liquor (44 mL). Medicines Your health care provider may prescribe medicine if lifestyle changes are not enough to get your blood pressure under control and if: Your systolic blood pressure is 130 or higher. Your diastolic blood pressure is 80 or higher. Take medicines only as told by your health care provider. Follow the directions carefully. Blood pressure medicines must be taken as told by your health care provider. The medicine does not work as well when you skip doses. Skipping doses also puts you at risk for problems. Monitoring Before you monitor your blood pressure: Do not smoke, drink caffeinated beverages, or exercise within 30 minutes before taking a measurement. Use the bathroom and empty your bladder (urinate). Sit quietly for at least 5 minutes before taking measurements. Monitor your blood pressure at home as told  by your health care provider. To do this: Sit with your back straight and supported. Place your feet flat on the floor. Do not cross your legs. Support your arm on a flat surface, such as a table. Make sure your upper arm is at heart level. Each time you measure, take two or three readings one minute apart and record the results. You may also need to have your blood pressure checked regularly by your health care provider. General information Talk with your health care provider about your diet, exercise habits, and other lifestyle factors that may be contributing to hypertension. Review all the medicines you take with your health care provider because there may be side effects or interactions. Keep all follow-up visits. Your health care provider can help you create and adjust your plan for managing your high blood pressure. Where to find more information National Heart, Lung, and Blood Institute: [employer] American Heart Association: www.heart.org Contact a health care provider if: You think you are having a reaction to medicines you have taken. You have repeated (recurrent) headaches. You feel dizzy. You have swelling in your ankles. You have trouble with your vision. Get help right away if: You develop a severe headache or confusion. You have unusual weakness or numbness, or you feel faint. You have severe pain in your chest or abdomen.  You vomit repeatedly. You have trouble breathing. These symptoms may be an emergency. Get help right away. Call 911. Do not wait to see if the symptoms will go away. Do not drive yourself to the hospital. Summary Hypertension is when the force of blood pumping through your arteries is too strong. If this condition is not controlled, it may put you at risk for serious complications. Your personal target blood pressure may vary depending on your medical conditions, your age, and other factors. For most people, a normal blood pressure is less than  120/80. Hypertension is managed by lifestyle changes, medicines, or both. Lifestyle changes to help manage hypertension include losing weight, eating a healthy, low-sodium diet, exercising more, stopping smoking, and limiting alcohol. This information is not intended to replace advice given to you by your health care provider. Make sure you discuss any questions you have with your health care provider. Document Revised: 04/29/2021 Document Reviewed: 04/29/2021 Elsevier Patient Education  2024 [employer].

## 2024-05-16 NOTE — Progress Notes (Signed)
 Subjective:  Patient ID: Brittany Howard, female    DOB: 08-15-1971  Age: 53 y.o. MRN: [id number]  CC:  Chief Complaint  Patient presents with   Hypertension    Dont check it at home. Taking medications each morning.     HPI Brittany Howard presents for follow up.  Has been traveling. Trip to [place] tomorrow - 10 days.   Hypertension: Treated with amlodipine  5 mg daily.  Meds were started in 2023. Home readings: none.  No missed doses.  No leg swelling, no side effects.  BP Readings from Last 3 Encounters:  05/16/24 (!) 144/92  11/15/23 124/64  05/12/23 118/88   Lab Results  Component Value Date   CREATININE 0.72 11/15/2023   Hyperlipidemia: Mild elevation previously but low ASCVD risk score, no statin. No immediate family members with early CVD,  Lab Results  Component Value Date   CHOL 224 (H) 11/15/2023   HDL 63.50 11/15/2023   LDLCALC 138 (H) 11/15/2023   TRIG 113.0 11/15/2023   CHOLHDL 4 11/15/2023   Lab Results  Component Value Date   ALT 24 11/15/2023   AST 24 11/15/2023   ALKPHOS 54 11/15/2023   BILITOT 0.5 11/15/2023  The 10-year ASCVD risk score (Arnett DK, et al., 2019) is: 2.5%   Values used to calculate the score:     Age: 45 years     Clincally relevant sex: Female     Is Non-Hispanic African American: No     Diabetic: No     Tobacco smoker: No     Systolic Blood Pressure: 144 mmHg     Is BP treated: Yes     HDL Cholesterol: 63.5 mg/dL     Total Cholesterol: 224 mg/dL  Cologuard negative 5/85/74 Pap testing in May - ok.  Flu vaccine today.   History Patient Active Problem List   Diagnosis Date Noted   Gluten-sensitive enteropathy 04/17/2012   Past Medical History:  Diagnosis Date   Allergy    Gluten intolerance    Hyperlipidemia    Hypertension 2002   Past Surgical History:  Procedure Laterality Date   CESAREAN SECTION  2003   TUBAL LIGATION     1 tube removed due to ectopic pregnancy in 2004.    Allergies   Allergen Reactions   Gluten Meal    Sulfa Antibiotics Rash   Prior to Admission medications   Medication Sig Start Date End Date Taking? Authorizing Provider  amLODipine  (NORVASC ) 5 MG tablet Take 1 tablet (5 mg total) by mouth daily. TAKE 1 TABLET(5 MG) BY MOUTH DAILY 11/15/23  Yes Levora Reyes SAUNDERS, MD  meloxicam  (MOBIC ) 7.5 MG tablet TAKE 1 TABLET(7.5 MG) BY MOUTH DAILY AS NEEDED FOR PAIN Patient taking differently: Take 7.5 mg by mouth as needed. 07/12/23  Yes Levora Reyes SAUNDERS, MD  amoxicillin -clavulanate (AUGMENTIN ) 875-125 MG tablet Take 1 tablet by mouth 2 (two) times daily. 12/31/23   Blair, Diane W, FNP  XIIDRA 5 % SOLN Apply 1 drop to eye 2 (two) times daily. 01/21/21   [provider]   Social History   Socioeconomic History   Marital status: Married    Spouse name: Not on file   Number of children: Not on file   Years of education: Not on file   Highest education level: Master's degree (e.g., MA, MS, MEng, MEd, MSW, MBA)  Occupational History   Not on file  Tobacco Use   Smoking status: Never   Smokeless tobacco: Not on  file  Substance and Sexual Activity   Alcohol use: Yes    Alcohol/week: 2.0 - 3.0 standard drinks of alcohol    Types: 2 - 3 Glasses of wine per week    Comment: social   Drug use: No   Sexual activity: Yes  Other Topics Concern   Not on file  Social History Narrative   Not on file   Social Drivers of Health   Financial Resource Strain: Low Risk  (05/16/2024)   Overall Financial Resource Strain (CARDIA)    Difficulty of Paying Living Expenses: Not hard at all  Food Insecurity: No Food Insecurity (05/16/2024)   Hunger Vital Sign    Worried About Running Out of Food in the Last Year: Never true    Ran Out of Food in the Last Year: Never true  Transportation Needs: No Transportation Needs (05/16/2024)   PRAPARE - Administrator, Civil Service (Medical): No    Lack of Transportation (Non-Medical): No  Physical Activity:  Insufficiently Active (05/16/2024)   Exercise Vital Sign    Days of Exercise per Week: 3 days    Minutes of Exercise per Session: 40 min  Stress: No Stress Concern Present (05/16/2024)   [employer] of Occupational Health - Occupational Stress Questionnaire    Feeling of Stress: Only a little  Social Connections: Moderately Isolated (05/16/2024)   Social Connection and Isolation Panel    Frequency of Communication with Friends and Family: Once a week    Frequency of Social Gatherings with Friends and Family: Once a week    Attends Religious Services: Never    Database administrator or Organizations: Yes    Attends Engineer, structural: More than 4 times per year    Marital Status: Married  Catering manager Violence: Not on file    Review of Systems  Constitutional:  Negative for fatigue and unexpected weight change.  Respiratory:  Negative for chest tightness and shortness of breath.   Cardiovascular:  Negative for chest pain, palpitations and leg swelling.  Gastrointestinal:  Negative for abdominal pain and blood in stool.  Neurological:  Negative for dizziness, syncope, light-headedness and headaches.     Objective:   Vitals:   05/16/24 0846 05/16/24 0924  BP: (!) 130/96 (!) 144/92  Pulse: (!) 58   Temp: 97.7 F (36.5 C)   TempSrc: Temporal   SpO2: 97%   Weight: 132 lb 12.8 oz (60.2 kg)   Height: 5' 4 (1.626 m)      Physical Exam Vitals reviewed.  Constitutional:      Appearance: Normal appearance. She is well-developed.  HENT:     Head: Normocephalic and atraumatic.  Eyes:     Conjunctiva/sclera: Conjunctivae normal.     Pupils: Pupils are equal, round, and reactive to light.  Neck:     Vascular: No carotid bruit.  Cardiovascular:     Rate and Rhythm: Normal rate and regular rhythm.     Heart sounds: Normal heart sounds.  Pulmonary:     Effort: Pulmonary effort is normal.     Breath sounds: Normal breath sounds.  Abdominal:     Palpations:  Abdomen is soft. There is no pulsatile mass.     Tenderness: There is no abdominal tenderness.  Musculoskeletal:     Right lower leg: No edema.     Left lower leg: No edema.  Skin:    General: Skin is warm and dry.  Neurological:     Mental Status: She  is alert and oriented to person, place, and time.  Psychiatric:        Mood and Affect: Mood normal.        Behavior: Behavior normal.        Assessment & Plan:  Brittany Howard is a 53 y.o. female . Essential hypertension - Plan: Comprehensive metabolic panel with GFR  - Previously controlled, elevated in office today.  Will hold on med changes, especially with trip overseas starting tomorrow.  Will have her monitor readings at home, send me an update in the next few weeks and can adjust meds accordingly at that time.  Handout given on hypertension management.  Check labs above.  Need for influenza vaccination - Plan: Flu vaccine trivalent PF, 6mos and older(Flulaval,Afluria,Fluarix,Fluzone)  Hyperlipidemia, unspecified hyperlipidemia type - Plan: Comprehensive metabolic panel with GFR, Lipid panel  - Repeat labs but low ASCVD risk score, no family history of early cardiac disease known.  No new meds for now.  Check labs and adjust plan accordingly.  No orders of the defined types were placed in this encounter.  Patient Instructions  Thank you for coming in today. No change in medications at this time, but keep a record of your blood pressures outside of the office and if remains elevated we may need to adjust the dose of your meds. Send me some readings in the next few weeks.  It was running a little higher in office today. If there are any concerns on your bloodwork, I will let you know. Have a safe trip and take care!  Managing Your Hypertension Hypertension, also called high blood pressure, is when the force of the blood pressing against the walls of the arteries is too strong. Arteries are blood vessels that carry blood from your  heart throughout your body. Hypertension forces the heart to work harder to pump blood and may cause the arteries to become narrow or stiff. Understanding blood pressure readings A blood pressure reading includes a higher number over a lower number: The first, or top, number is called the systolic pressure. It is a measure of the pressure in your arteries as your heart beats. The second, or bottom number, is called the diastolic pressure. It is a measure of the pressure in your arteries as the heart relaxes. For most people, a normal blood pressure is below 120/80. Your personal target blood pressure may vary depending on your medical conditions, your age, and other factors. Blood pressure is classified into four stages. Based on your blood pressure reading, your health care provider may use the following stages to determine what type of treatment you need, if any. Systolic pressure and diastolic pressure are measured in a unit called millimeters of mercury (mmHg). Normal Systolic pressure: below 120. Diastolic pressure: below 80. Elevated Systolic pressure: 120-129. Diastolic pressure: below 80. Hypertension stage 1 Systolic pressure: 130-139. Diastolic pressure: 80-89. Hypertension stage 2 Systolic pressure: 140 or above. Diastolic pressure: 90 or above. How can this condition affect me? Managing your hypertension is very important. Over time, hypertension can damage the arteries and decrease blood flow to parts of the body, including the brain, heart, and kidneys. Having untreated or uncontrolled hypertension can lead to: A heart attack. A stroke. A weakened blood vessel (aneurysm). Heart failure. Kidney damage. Eye damage. Memory and concentration problems. Vascular dementia. What actions can I take to manage this condition? Hypertension can be managed by making lifestyle changes and possibly by taking medicines. Your health care provider will help you make  a plan to bring your  blood pressure within a normal range. You may be referred for counseling on a healthy diet and physical activity. Nutrition  Eat a diet that is high in fiber and potassium, and low in salt (sodium), added sugar, and fat. An example eating plan is called the DASH diet. DASH stands for Dietary Approaches to Stop Hypertension. To eat this way: Eat plenty of fresh fruits and vegetables. Try to fill one-half of your plate at each meal with fruits and vegetables. Eat whole grains, such as whole-wheat pasta, brown rice, or whole-grain bread. Fill about one-fourth of your plate with whole grains. Eat low-fat dairy products. Avoid fatty cuts of meat, processed or cured meats, and poultry with skin. Fill about one-fourth of your plate with lean proteins such as fish, chicken without skin, beans, eggs, and tofu. Avoid pre-made and processed foods. These tend to be higher in sodium, added sugar, and fat. Reduce your daily sodium intake. Many people with hypertension should eat less than 1,500 mg of sodium a day. Lifestyle  Work with your health care provider to maintain a healthy body weight or to lose weight. Ask what an ideal weight is for you. Get at least 30 minutes of exercise that causes your heart to beat faster (aerobic exercise) most days of the week. Activities may include walking, swimming, or biking. Include exercise to strengthen your muscles (resistance exercise), such as weight lifting, as part of your weekly exercise routine. Try to do these types of exercises for 30 minutes at least 3 days a week. Do not use any products that contain nicotine or tobacco. These products include cigarettes, chewing tobacco, and vaping devices, such as e-cigarettes. If you need help quitting, ask your health care provider. Control any long-term (chronic) conditions you have, such as high cholesterol or diabetes. Identify your sources of stress and find ways to manage stress. This may include meditation, deep  breathing, or making time for fun activities. Alcohol use Do not drink alcohol if: Your health care provider tells you not to drink. You are pregnant, may be pregnant, or are planning to become pregnant. If you drink alcohol: Limit how much you have to: 0-1 drink a day for women. 0-2 drinks a day for men. Know how much alcohol is in your drink. In the [place] one drink equals one 12 oz bottle of beer (355 mL), one 5 oz glass of wine (148 mL), or one 1 oz glass of hard liquor (44 mL). Medicines Your health care provider may prescribe medicine if lifestyle changes are not enough to get your blood pressure under control and if: Your systolic blood pressure is 130 or higher. Your diastolic blood pressure is 80 or higher. Take medicines only as told by your health care provider. Follow the directions carefully. Blood pressure medicines must be taken as told by your health care provider. The medicine does not work as well when you skip doses. Skipping doses also puts you at risk for problems. Monitoring Before you monitor your blood pressure: Do not smoke, drink caffeinated beverages, or exercise within 30 minutes before taking a measurement. Use the bathroom and empty your bladder (urinate). Sit quietly for at least 5 minutes before taking measurements. Monitor your blood pressure at home as told by your health care provider. To do this: Sit with your back straight and supported. Place your feet flat on the floor. Do not cross your legs. Support your arm on a flat surface, such as a table.  Make sure your upper arm is at heart level. Each time you measure, take two or three readings one minute apart and record the results. You may also need to have your blood pressure checked regularly by your health care provider. General information Talk with your health care provider about your diet, exercise habits, and other lifestyle factors that may be contributing to hypertension. Review all the  medicines you take with your health care provider because there may be side effects or interactions. Keep all follow-up visits. Your health care provider can help you create and adjust your plan for managing your high blood pressure. Where to find more information National Heart, Lung, and Blood Institute: PopSteam.is American Heart Association: www.heart.org Contact a health care provider if: You think you are having a reaction to medicines you have taken. You have repeated (recurrent) headaches. You feel dizzy. You have swelling in your ankles. You have trouble with your vision. Get help right away if: You develop a severe headache or confusion. You have unusual weakness or numbness, or you feel faint. You have severe pain in your chest or abdomen. You vomit repeatedly. You have trouble breathing. These symptoms may be an emergency. Get help right away. Call 911. Do not wait to see if the symptoms will go away. Do not drive yourself to the hospital. Summary Hypertension is when the force of blood pumping through your arteries is too strong. If this condition is not controlled, it may put you at risk for serious complications. Your personal target blood pressure may vary depending on your medical conditions, your age, and other factors. For most people, a normal blood pressure is less than 120/80. Hypertension is managed by lifestyle changes, medicines, or both. Lifestyle changes to help manage hypertension include losing weight, eating a healthy, low-sodium diet, exercising more, stopping smoking, and limiting alcohol. This information is not intended to replace advice given to you by your health care provider. Make sure you discuss any questions you have with your health care provider. Document Revised: 04/29/2021 Document Reviewed: 04/29/2021 Elsevier Patient Education  2024 Elsevier Inc.     Signed,   Reyes Pines, MD [facility], [facility] 05/16/24 9:25 AM

## 2024-05-17 ENCOUNTER — Ambulatory Visit: Admitting: Family Medicine

## 2024-05-21 ENCOUNTER — Ambulatory Visit: Payer: Self-pay | Admitting: Family Medicine

## 2024-05-27 NOTE — Progress Notes (Signed)
 LVM to call office about lab results

## 2024-06-20 ENCOUNTER — Encounter: Payer: Self-pay | Admitting: Family Medicine

## 2024-06-20 DIAGNOSIS — H524 Presbyopia: Secondary | ICD-10-CM | POA: Diagnosis not present

## 2024-07-18 DIAGNOSIS — N952 Postmenopausal atrophic vaginitis: Secondary | ICD-10-CM | POA: Diagnosis not present

## 2024-07-18 DIAGNOSIS — N951 Menopausal and female climacteric states: Secondary | ICD-10-CM | POA: Diagnosis not present

## 2024-08-11 ENCOUNTER — Encounter: Payer: Self-pay | Admitting: Family Medicine

## 2024-08-11 DIAGNOSIS — I10 Essential (primary) hypertension: Secondary | ICD-10-CM

## 2024-08-12 ENCOUNTER — Other Ambulatory Visit: Payer: Self-pay

## 2024-08-12 NOTE — Telephone Encounter (Signed)
 Patient has sent in an update   Last message you advise:  Based on those readings it may be worth trying a higher dose of amlodipine .  You can increase your 5 mg to 10 mg or 2 pills/day.  I think you would tolerate that dose, but watch for any lightheadedness, dizziness or readings dropping too low and let me know if that occurs.  This would be unlikely.  Sometimes I do see some ankle swelling at the higher dose, monitor for any side effects and keep me posted with an update on readings in the next week or two on that higher dose.  If you do tolerate that dose, let me know and I can send in the 10 mg to your pharmacy before you run out.  If any new or worse symptoms, be seen but I do not expect that to occur.   Dr. Levora

## 2024-08-13 MED ORDER — AMLODIPINE BESYLATE 10 MG PO TABS: 10.0000 mg | ORAL_TABLET | Freq: Every day | ORAL | 1 refills | Status: AC

## 2024-11-15 ENCOUNTER — Encounter: Admitting: Family Medicine
